# Patient Record
Sex: Female | Born: 1980 | Race: Asian | Hispanic: No | Marital: Single | Smoking: Former smoker
Health system: Southern US, Community
[De-identification: ages and names within clinical notes are randomized; demographics above are authoritative.]

## PROBLEM LIST (undated history)

## (undated) DIAGNOSIS — F32A Depression, unspecified: Secondary | ICD-10-CM

## (undated) DIAGNOSIS — F329 Major depressive disorder, single episode, unspecified: Secondary | ICD-10-CM

## (undated) DIAGNOSIS — D649 Anemia, unspecified: Secondary | ICD-10-CM

## (undated) DIAGNOSIS — O24419 Gestational diabetes mellitus in pregnancy, unspecified control: Secondary | ICD-10-CM

## (undated) HISTORY — PX: WISDOM TOOTH EXTRACTION: SHX21

## (undated) HISTORY — DX: Gestational diabetes mellitus in pregnancy, unspecified control: O24.419

---
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
## 2003-10-25 ENCOUNTER — Emergency Department (HOSPITAL_COMMUNITY): Admission: EM | Admit: 2003-10-25 | Discharge: 2003-10-25 | Payer: Self-pay | Admitting: Emergency Medicine

## 2004-02-05 HISTORY — PX: OTHER SURGICAL HISTORY: SHX169

## 2004-05-27 ENCOUNTER — Emergency Department (HOSPITAL_COMMUNITY): Admission: EM | Admit: 2004-05-27 | Discharge: 2004-05-27 | Payer: Self-pay | Admitting: *Deleted

## 2004-05-31 ENCOUNTER — Ambulatory Visit (HOSPITAL_COMMUNITY): Admission: RE | Admit: 2004-05-31 | Discharge: 2004-05-31 | Payer: Self-pay | Admitting: Orthopedic Surgery

## 2006-08-31 ENCOUNTER — Emergency Department (HOSPITAL_COMMUNITY): Admission: EM | Admit: 2006-08-31 | Discharge: 2006-08-31 | Payer: Self-pay | Admitting: Family Medicine

## 2006-09-03 ENCOUNTER — Emergency Department (HOSPITAL_COMMUNITY): Admission: EM | Admit: 2006-09-03 | Discharge: 2006-09-03 | Payer: Self-pay | Admitting: Family Medicine
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
## 2012-04-22 ENCOUNTER — Other Ambulatory Visit: Payer: Self-pay | Admitting: Family Medicine

## 2012-04-22 DIAGNOSIS — N63 Unspecified lump in unspecified breast: Secondary | ICD-10-CM

## 2012-04-29 ENCOUNTER — Ambulatory Visit
Admission: RE | Admit: 2012-04-29 | Discharge: 2012-04-29 | Disposition: A | Discharge status: Home / Self Care | Payer: BC Managed Care – PPO | Source: Ambulatory Visit | Attending: Family Medicine | Admitting: Family Medicine

## 2012-04-29 DIAGNOSIS — N63 Unspecified lump in unspecified breast: Secondary | ICD-10-CM

## 2012-06-02 ENCOUNTER — Other Ambulatory Visit: Payer: Self-pay

## 2012-06-22 ENCOUNTER — Other Ambulatory Visit: Payer: Self-pay | Admitting: Obstetrics and Gynecology

## 2012-06-22 ENCOUNTER — Other Ambulatory Visit (HOSPITAL_COMMUNITY)
Admission: RE | Admit: 2012-06-22 | Discharge: 2012-06-22 | Disposition: A | Discharge status: Home / Self Care | Payer: BC Managed Care – PPO | Source: Ambulatory Visit | Attending: Obstetrics and Gynecology | Admitting: Obstetrics and Gynecology

## 2012-06-22 DIAGNOSIS — Z1151 Encounter for screening for human papillomavirus (HPV): Secondary | ICD-10-CM | POA: Insufficient documentation

## 2012-06-22 DIAGNOSIS — Z113 Encounter for screening for infections with a predominantly sexual mode of transmission: Secondary | ICD-10-CM | POA: Insufficient documentation

## 2012-06-22 DIAGNOSIS — Z01419 Encounter for gynecological examination (general) (routine) without abnormal findings: Secondary | ICD-10-CM | POA: Insufficient documentation

## 2012-06-23 ENCOUNTER — Ambulatory Visit (HOSPITAL_COMMUNITY)
Admission: RE | Admit: 2012-06-23 | Discharge: 2012-06-23 | Disposition: A | Discharge status: Home / Self Care | Payer: BC Managed Care – PPO | Source: Ambulatory Visit | Attending: Obstetrics and Gynecology | Admitting: Obstetrics and Gynecology

## 2012-06-23 ENCOUNTER — Encounter (HOSPITAL_COMMUNITY): Payer: Self-pay

## 2012-06-23 ENCOUNTER — Ambulatory Visit (HOSPITAL_COMMUNITY): Payer: BC Managed Care – PPO

## 2012-06-23 VITALS — BP 116/67 | HR 69 | Wt 123.0 lb

## 2012-06-23 DIAGNOSIS — B181 Chronic viral hepatitis B without delta-agent: Secondary | ICD-10-CM

## 2012-06-23 DIAGNOSIS — O98419 Viral hepatitis complicating pregnancy, unspecified trimester: Secondary | ICD-10-CM

## 2012-06-23 NOTE — Consult Note (Signed)
Maternal Fetal Medicine Consultation  Requesting Provider(s): Gerald Leitz, MD  Reason for consultation: Hepatitis B surface antigen positive  HPI: Nicole Hooper is a 32 yo G1P0 currently at 10 1/7 weeks who is seen for consultation due to HepB SA positive status - noted on new OB labs.  Nicole Hooper reports some spotting during the first trimester - noted to have a friable cervical polyp on her new OB exam.  Her prenatal course has otherwise been uncomplicated.  The patient's HepBe antigen was negative.  Viral load was 1410 IU/ml.  Her baseline liver function tests were also within normal limits. She is without complaints today.  OB History: OB History   Grav Para Term Preterm Abortions TAB SAB Ect Mult Living   1               PMH: as above  PSH: left foot surgery  Meds: PNV  Allergies: NKDA  FH: denies family history of birth defects or hereditary disorders  Soc: quit smoking (e-cigarettes), denies ETOH or illicit drug use during pregnancy   PE:   Filed Vitals:   06/23/12 1532  BP: 116/67  Pulse: 69      A/P: 1) Single IUP at 10 1/7 weeks         2) Chronic HBV - HBV - Pregnancy is generally well-tolerated by women with chronic hepatitis B infection who do not have advanced liver disease.  Because pregnancy is an immune-tolerant state, there is a slightly increased risk for Hep B flares.  Recommend checking LFTs each trimester and post partum.  Nicole Hooper's Hep B viral load is 1410 IU/ml - not in the anti-viral treatment range.  Additionally, she is HepBe antigen negative and would anticipate a favorable outcome. The biggest concern is with perinatal transmission. Without prophylaxis, the perinatal transmission rate approaches 90%.  With HepB IG and the vaccination series as a newborn, the transmission rate is lowered to 5-10%.  Would recommend avoiding operative vaginal deliveries - recommend that Peds be notified at the time of delivery.  The newborn will need HepB IG and Hep B  vaccination series during the first 6 weeks of life.  Breast feeding is safe (if the newborn receives appropriate prophylaxis).  Cesarean delivery is not indicated for Hepatitis B - would reserve for obstetric indications only.    Thank you for the opportunity to be a part of the care of Nicole Hooper. Please contact our office if we can be of further assistance.   I spent approximately 30 minutes with this patient with over 50% of time spent in face-to-face counseling.  Alpha Gula, MD Maternal-Fetal Medicine

## 2012-06-23 NOTE — ED Notes (Signed)
Patient states she is experiencing spotting. She had a pap smear earlier in the week and states it is decreasing.

## 2012-10-01 ENCOUNTER — Inpatient Hospital Stay (HOSPITAL_COMMUNITY): Payer: BC Managed Care – PPO | Admitting: Anesthesiology

## 2012-10-01 ENCOUNTER — Inpatient Hospital Stay (HOSPITAL_COMMUNITY)
Admission: AD | Admit: 2012-10-01 | Discharge: 2012-10-04 | DRG: 370 | Disposition: A | Discharge status: Home / Self Care | Payer: BC Managed Care – PPO | Source: Ambulatory Visit | Attending: Obstetrics and Gynecology | Admitting: Obstetrics and Gynecology

## 2012-10-01 ENCOUNTER — Encounter (HOSPITAL_COMMUNITY): Payer: Self-pay | Admitting: *Deleted

## 2012-10-01 ENCOUNTER — Inpatient Hospital Stay (HOSPITAL_COMMUNITY): Payer: BC Managed Care – PPO

## 2012-10-01 ENCOUNTER — Encounter (HOSPITAL_COMMUNITY)
Admission: AD | Disposition: A | Discharge status: Home / Self Care | Payer: Self-pay | Source: Ambulatory Visit | Attending: Obstetrics and Gynecology

## 2012-10-01 ENCOUNTER — Encounter (HOSPITAL_COMMUNITY): Payer: Self-pay | Admitting: Anesthesiology

## 2012-10-01 DIAGNOSIS — O26619 Liver and biliary tract disorders in pregnancy, unspecified trimester: Secondary | ICD-10-CM | POA: Diagnosis present

## 2012-10-01 DIAGNOSIS — B181 Chronic viral hepatitis B without delta-agent: Secondary | ICD-10-CM | POA: Diagnosis present

## 2012-10-01 DIAGNOSIS — O321XX0 Maternal care for breech presentation, not applicable or unspecified: Principal | ICD-10-CM | POA: Diagnosis present

## 2012-10-01 DIAGNOSIS — IMO0002 Reserved for concepts with insufficient information to code with codable children: Secondary | ICD-10-CM | POA: Diagnosis present

## 2012-10-01 DIAGNOSIS — Z98891 History of uterine scar from previous surgery: Secondary | ICD-10-CM

## 2012-10-01 LAB — URINALYSIS, ROUTINE W REFLEX MICROSCOPIC
Nitrite: NEGATIVE
Protein, ur: NEGATIVE mg/dL
Specific Gravity, Urine: 1.015 (ref 1.005–1.030)
Urobilinogen, UA: 0.2 mg/dL (ref 0.0–1.0)
pH: 6.5 (ref 5.0–8.0)

## 2012-10-01 LAB — URINE MICROSCOPIC-ADD ON

## 2012-10-01 LAB — CBC
MCH: 29.7 pg (ref 26.0–34.0)
MCHC: 33.7 g/dL (ref 30.0–36.0)
Platelets: 198 10*3/uL (ref 150–400)
RBC: 3.44 MIL/uL — ABNORMAL LOW (ref 3.87–5.11)

## 2012-10-01 LAB — TYPE AND SCREEN: ABO/RH(D): O POS

## 2012-10-01 LAB — RPR: RPR Ser Ql: NONREACTIVE

## 2012-10-01 SURGERY — Surgical Case
Anesthesia: Spinal | Site: Abdomen | Wound class: Clean Contaminated

## 2012-10-01 MED ORDER — SCOPOLAMINE 1 MG/3DAYS TD PT72
MEDICATED_PATCH | TRANSDERMAL | Status: AC
  Filled 2012-10-01: qty 1

## 2012-10-01 MED ORDER — NALOXONE HCL 1 MG/ML IJ SOLN
1.0000 ug/kg/h | INTRAVENOUS | Status: DC | PRN
  Filled 2012-10-01: qty 2

## 2012-10-01 MED ORDER — KETOROLAC TROMETHAMINE 60 MG/2ML IM SOLN
INTRAMUSCULAR | Status: AC
  Filled 2012-10-01: qty 2

## 2012-10-01 MED ORDER — TERBUTALINE SULFATE 1 MG/ML IJ SOLN
INTRAMUSCULAR | Status: AC
  Administered 2012-10-01: 0.25 mg via SUBCUTANEOUS
  Filled 2012-10-01: qty 1

## 2012-10-01 MED ORDER — DIPHENHYDRAMINE HCL 25 MG PO CAPS
25.0000 mg | ORAL_CAPSULE | Freq: Four times a day (QID) | ORAL | Status: DC | PRN
  Filled 2012-10-01: qty 1

## 2012-10-01 MED ORDER — BUPIVACAINE IN DEXTROSE 0.75-8.25 % IT SOLN
INTRATHECAL | Status: DC | PRN
  Administered 2012-10-01: 11.25 mg via INTRATHECAL

## 2012-10-01 MED ORDER — ONDANSETRON HCL 4 MG/2ML IJ SOLN
INTRAMUSCULAR | Status: DC | PRN
  Administered 2012-10-01: 4 mg via INTRAVENOUS

## 2012-10-01 MED ORDER — FENTANYL CITRATE 0.05 MG/ML IJ SOLN
INTRAMUSCULAR | Status: AC
  Filled 2012-10-01: qty 2

## 2012-10-01 MED ORDER — LACTATED RINGERS IV SOLN
INTRAVENOUS | Status: DC | PRN
  Administered 2012-10-01 (×2): via INTRAVENOUS

## 2012-10-01 MED ORDER — IBUPROFEN 600 MG PO TABS
600.0000 mg | ORAL_TABLET | Freq: Four times a day (QID) | ORAL | Status: DC
  Administered 2012-10-01 – 2012-10-04 (×13): 600 mg via ORAL
  Filled 2012-10-01 (×14): qty 1

## 2012-10-01 MED ORDER — KETOROLAC TROMETHAMINE 30 MG/ML IJ SOLN: 30.0000 mg | Freq: Four times a day (QID) | INTRAMUSCULAR | Status: AC | PRN

## 2012-10-01 MED ORDER — DIPHENHYDRAMINE HCL 50 MG/ML IJ SOLN
INTRAMUSCULAR | Status: AC
  Filled 2012-10-01: qty 1

## 2012-10-01 MED ORDER — SCOPOLAMINE 1 MG/3DAYS TD PT72
1.0000 | MEDICATED_PATCH | Freq: Once | TRANSDERMAL | Status: AC
  Administered 2012-10-01: 1.5 mg via TRANSDERMAL

## 2012-10-01 MED ORDER — DIPHENHYDRAMINE HCL 50 MG/ML IJ SOLN
12.5000 mg | INTRAMUSCULAR | Status: DC | PRN
  Administered 2012-10-01: 12.5 mg via INTRAVENOUS

## 2012-10-01 MED ORDER — LANOLIN HYDROUS EX OINT: 1.0000 "application " | TOPICAL_OINTMENT | CUTANEOUS | Status: DC | PRN

## 2012-10-01 MED ORDER — MENTHOL 3 MG MT LOZG: 1.0000 | LOZENGE | OROMUCOSAL | Status: DC | PRN

## 2012-10-01 MED ORDER — ZOLPIDEM TARTRATE 5 MG PO TABS: 5.0000 mg | ORAL_TABLET | Freq: Every evening | ORAL | Status: DC | PRN

## 2012-10-01 MED ORDER — PHENYLEPHRINE HCL 10 MG/ML IJ SOLN
INTRAMUSCULAR | Status: DC | PRN
  Administered 2012-10-01: 80 ug via INTRAVENOUS
  Administered 2012-10-01: 40 ug via INTRAVENOUS
  Administered 2012-10-01: 80 ug via INTRAVENOUS

## 2012-10-01 MED ORDER — CEFAZOLIN SODIUM-DEXTROSE 2-3 GM-% IV SOLR
INTRAVENOUS | Status: AC
  Filled 2012-10-01: qty 50

## 2012-10-01 MED ORDER — DIPHENHYDRAMINE HCL 25 MG PO CAPS
25.0000 mg | ORAL_CAPSULE | ORAL | Status: DC | PRN
  Administered 2012-10-01: 25 mg via ORAL
  Filled 2012-10-01: qty 1

## 2012-10-01 MED ORDER — OXYTOCIN 40 UNITS IN LACTATED RINGERS INFUSION - SIMPLE MED: 62.5000 mL/h | INTRAVENOUS | Status: DC

## 2012-10-01 MED ORDER — PRENATAL MULTIVITAMIN CH
1.0000 | ORAL_TABLET | Freq: Every day | ORAL | Status: DC
Start: 2012-10-01 — End: 2012-10-01

## 2012-10-01 MED ORDER — MORPHINE SULFATE (PF) 0.5 MG/ML IJ SOLN
INTRAMUSCULAR | Status: DC | PRN
  Administered 2012-10-01: .1 mg via INTRATHECAL

## 2012-10-01 MED ORDER — OXYTOCIN 10 UNIT/ML IJ SOLN
40.0000 [IU] | INTRAVENOUS | Status: DC | PRN
  Administered 2012-10-01: 40 [IU] via INTRAVENOUS

## 2012-10-01 MED ORDER — NALBUPHINE HCL 10 MG/ML IJ SOLN
5.0000 mg | INTRAMUSCULAR | Status: DC | PRN
  Filled 2012-10-01: qty 1

## 2012-10-01 MED ORDER — MEPERIDINE HCL 25 MG/ML IJ SOLN: 6.2500 mg | INTRAMUSCULAR | Status: DC | PRN

## 2012-10-01 MED ORDER — SIMETHICONE 80 MG PO CHEW
80.0000 mg | CHEWABLE_TABLET | Freq: Three times a day (TID) | ORAL | Status: DC
  Administered 2012-10-01 – 2012-10-03 (×11): 80 mg via ORAL

## 2012-10-01 MED ORDER — TERBUTALINE SULFATE 1 MG/ML IJ SOLN
0.2500 mg | Freq: Once | INTRAMUSCULAR | Status: AC
  Administered 2012-10-01: 0.25 mg via SUBCUTANEOUS

## 2012-10-01 MED ORDER — CEFAZOLIN SODIUM-DEXTROSE 2-3 GM-% IV SOLR
INTRAVENOUS | Status: DC | PRN
  Administered 2012-10-01: 2 g via INTRAVENOUS

## 2012-10-01 MED ORDER — BETAMETHASONE SOD PHOS & ACET 6 (3-3) MG/ML IJ SUSP
12.0000 mg | Freq: Once | INTRAMUSCULAR | Status: AC
  Administered 2012-10-01: 12 mg via INTRAMUSCULAR
  Filled 2012-10-01: qty 2

## 2012-10-01 MED ORDER — PRENATAL MULTIVITAMIN CH
1.0000 | ORAL_TABLET | Freq: Every day | ORAL | Status: DC
  Administered 2012-10-01 – 2012-10-04 (×4): 1 via ORAL
  Filled 2012-10-01 (×4): qty 1

## 2012-10-01 MED ORDER — DIBUCAINE 1 % RE OINT: 1.0000 "application " | TOPICAL_OINTMENT | RECTAL | Status: DC | PRN

## 2012-10-01 MED ORDER — NALBUPHINE HCL 10 MG/ML IJ SOLN
5.0000 mg | INTRAMUSCULAR | Status: DC | PRN
Start: 2012-10-01 — End: 2012-10-04
  Filled 2012-10-01: qty 1

## 2012-10-01 MED ORDER — LACTATED RINGERS IV SOLN: INTRAVENOUS | Status: DC

## 2012-10-01 MED ORDER — ONDANSETRON HCL 4 MG/2ML IJ SOLN: 4.0000 mg | INTRAMUSCULAR | Status: DC | PRN

## 2012-10-01 MED ORDER — METHYLERGONOVINE MALEATE 0.2 MG PO TABS: 0.2000 mg | ORAL_TABLET | ORAL | Status: DC | PRN

## 2012-10-01 MED ORDER — KETOROLAC TROMETHAMINE 60 MG/2ML IM SOLN
60.0000 mg | Freq: Once | INTRAMUSCULAR | Status: AC | PRN
  Administered 2012-10-01: 60 mg via INTRAMUSCULAR
  Filled 2012-10-01: qty 2

## 2012-10-01 MED ORDER — METHYLERGONOVINE MALEATE 0.2 MG/ML IJ SOLN: 0.2000 mg | INTRAMUSCULAR | Status: DC | PRN

## 2012-10-01 MED ORDER — FENTANYL CITRATE 0.05 MG/ML IJ SOLN: 25.0000 ug | INTRAMUSCULAR | Status: DC | PRN

## 2012-10-01 MED ORDER — ONDANSETRON HCL 4 MG PO TABS: 4.0000 mg | ORAL_TABLET | ORAL | Status: DC | PRN

## 2012-10-01 MED ORDER — MEDROXYPROGESTERONE ACETATE 150 MG/ML IM SUSP: 150.0000 mg | INTRAMUSCULAR | Status: DC | PRN

## 2012-10-01 MED ORDER — SENNOSIDES-DOCUSATE SODIUM 8.6-50 MG PO TABS
2.0000 | ORAL_TABLET | Freq: Every day | ORAL | Status: DC
  Administered 2012-10-01 – 2012-10-03 (×3): 2 via ORAL

## 2012-10-01 MED ORDER — OXYTOCIN 10 UNIT/ML IJ SOLN
INTRAMUSCULAR | Status: AC
  Filled 2012-10-01: qty 4

## 2012-10-01 MED ORDER — LACTATED RINGERS IV BOLUS (SEPSIS): 1000.0000 mL | INTRAVENOUS | Status: DC

## 2012-10-01 MED ORDER — ONDANSETRON HCL 4 MG/2ML IJ SOLN
INTRAMUSCULAR | Status: AC
  Filled 2012-10-01: qty 2

## 2012-10-01 MED ORDER — TETANUS-DIPHTH-ACELL PERTUSSIS 5-2.5-18.5 LF-MCG/0.5 IM SUSP: 0.5000 mL | Freq: Once | INTRAMUSCULAR | Status: DC

## 2012-10-01 MED ORDER — LACTATED RINGERS IV SOLN
INTRAVENOUS | Status: DC
  Administered 2012-10-01: 05:00:00 via INTRAVENOUS

## 2012-10-01 MED ORDER — WITCH HAZEL-GLYCERIN EX PADS: 1.0000 "application " | MEDICATED_PAD | CUTANEOUS | Status: DC | PRN

## 2012-10-01 MED ORDER — MORPHINE SULFATE 0.5 MG/ML IJ SOLN
INTRAMUSCULAR | Status: AC
  Filled 2012-10-01: qty 10

## 2012-10-01 MED ORDER — SODIUM CHLORIDE 0.9 % IJ SOLN: 3.0000 mL | INTRAMUSCULAR | Status: DC | PRN

## 2012-10-01 MED ORDER — METOCLOPRAMIDE HCL 5 MG/ML IJ SOLN: 10.0000 mg | Freq: Three times a day (TID) | INTRAMUSCULAR | Status: DC | PRN

## 2012-10-01 MED ORDER — FERROUS SULFATE 325 (65 FE) MG PO TABS
325.0000 mg | ORAL_TABLET | Freq: Two times a day (BID) | ORAL | Status: DC
  Administered 2012-10-01 – 2012-10-04 (×7): 325 mg via ORAL
  Filled 2012-10-01 (×7): qty 1

## 2012-10-01 MED ORDER — FENTANYL CITRATE 0.05 MG/ML IJ SOLN
INTRAMUSCULAR | Status: DC | PRN
  Administered 2012-10-01: 12.5 ug via INTRATHECAL

## 2012-10-01 MED ORDER — PHENYLEPHRINE 40 MCG/ML (10ML) SYRINGE FOR IV PUSH (FOR BLOOD PRESSURE SUPPORT)
PREFILLED_SYRINGE | INTRAVENOUS | Status: AC
  Filled 2012-10-01: qty 5

## 2012-10-01 MED ORDER — FENTANYL CITRATE 0.05 MG/ML IJ SOLN
INTRAMUSCULAR | Status: DC | PRN
  Administered 2012-10-01: 87.5 ug via INTRAVENOUS

## 2012-10-01 MED ORDER — MORPHINE SULFATE (PF) 0.5 MG/ML IJ SOLN
INTRAMUSCULAR | Status: DC | PRN
  Administered 2012-10-01: 4.9 mg via INTRAVENOUS

## 2012-10-01 MED ORDER — CITRIC ACID-SODIUM CITRATE 334-500 MG/5ML PO SOLN
ORAL | Status: AC
  Administered 2012-10-01: 30 mL
  Filled 2012-10-01: qty 15

## 2012-10-01 MED ORDER — NALOXONE HCL 0.4 MG/ML IJ SOLN: 0.4000 mg | INTRAMUSCULAR | Status: DC | PRN

## 2012-10-01 MED ORDER — SIMETHICONE 80 MG PO CHEW: 80.0000 mg | CHEWABLE_TABLET | ORAL | Status: DC | PRN

## 2012-10-01 MED ORDER — DIPHENHYDRAMINE HCL 50 MG/ML IJ SOLN: 25.0000 mg | INTRAMUSCULAR | Status: DC | PRN

## 2012-10-01 MED ORDER — OXYCODONE-ACETAMINOPHEN 5-325 MG PO TABS
1.0000 | ORAL_TABLET | ORAL | Status: DC | PRN
  Administered 2012-10-02 – 2012-10-04 (×6): 1 via ORAL
  Filled 2012-10-01 (×5): qty 1
  Filled 2012-10-01: qty 2
  Filled 2012-10-01: qty 1

## 2012-10-01 MED ORDER — ONDANSETRON HCL 4 MG/2ML IJ SOLN: 4.0000 mg | Freq: Three times a day (TID) | INTRAMUSCULAR | Status: DC | PRN

## 2012-10-01 SURGICAL SUPPLY — 39 items
BARRIER ADHS 3X4 INTERCEED (GAUZE/BANDAGES/DRESSINGS) IMPLANT
BENZOIN TINCTURE PRP APPL 2/3 (GAUZE/BANDAGES/DRESSINGS) ×2 IMPLANT
CLAMP CORD UMBIL (MISCELLANEOUS) IMPLANT
CLOTH BEACON ORANGE TIMEOUT ST (SAFETY) ×2 IMPLANT
CONTAINER PREFILL 10% NBF 15ML (MISCELLANEOUS) IMPLANT
DRAPE LG THREE QUARTER DISP (DRAPES) ×2 IMPLANT
DRSG OPSITE POSTOP 4X10 (GAUZE/BANDAGES/DRESSINGS) ×2 IMPLANT
DURAPREP 26ML APPLICATOR (WOUND CARE) ×2 IMPLANT
ELECT REM PT RETURN 9FT ADLT (ELECTROSURGICAL) ×2
ELECTRODE REM PT RTRN 9FT ADLT (ELECTROSURGICAL) ×1 IMPLANT
EXTRACTOR VACUUM M CUP 4 TUBE (SUCTIONS) IMPLANT
GLOVE BIOGEL M 6.5 STRL (GLOVE) ×4 IMPLANT
GLOVE BIOGEL PI IND STRL 6.5 (GLOVE) ×1 IMPLANT
GLOVE BIOGEL PI INDICATOR 6.5 (GLOVE) ×1
GOWN PREVENTION PLUS XLARGE (GOWN DISPOSABLE) ×2 IMPLANT
GOWN STRL REIN XL XLG (GOWN DISPOSABLE) ×4 IMPLANT
KIT ABG SYR 3ML LUER SLIP (SYRINGE) ×2 IMPLANT
NEEDLE HYPO 25X5/8 SAFETYGLIDE (NEEDLE) ×2 IMPLANT
NS IRRIG 1000ML POUR BTL (IV SOLUTION) ×2 IMPLANT
PACK C SECTION WH (CUSTOM PROCEDURE TRAY) ×2 IMPLANT
PAD ABD 7.5X8 STRL (GAUZE/BANDAGES/DRESSINGS) ×2 IMPLANT
PAD OB MATERNITY 4.3X12.25 (PERSONAL CARE ITEMS) ×2 IMPLANT
RTRCTR C-SECT PINK 25CM LRG (MISCELLANEOUS) ×2 IMPLANT
RTRCTR C-SECT PINK 34CM XLRG (MISCELLANEOUS) IMPLANT
STAPLER VISISTAT 35W (STAPLE) IMPLANT
STRIP CLOSURE SKIN 1/2X4 (GAUZE/BANDAGES/DRESSINGS) ×2 IMPLANT
SUT PDS AB 0 CT1 27 (SUTURE) ×4 IMPLANT
SUT PLAIN 0 NONE (SUTURE) IMPLANT
SUT VIC AB 0 CTX 36 (SUTURE) ×3
SUT VIC AB 0 CTX36XBRD ANBCTRL (SUTURE) ×3 IMPLANT
SUT VIC AB 2-0 CT1 27 (SUTURE) ×1
SUT VIC AB 2-0 CT1 TAPERPNT 27 (SUTURE) ×1 IMPLANT
SUT VIC AB 3-0 SH 27 (SUTURE) ×1
SUT VIC AB 3-0 SH 27X BRD (SUTURE) ×1 IMPLANT
SUT VIC AB 4-0 KS 27 (SUTURE) ×2 IMPLANT
TAPE CLOTH SURG 4X10 WHT LF (GAUZE/BANDAGES/DRESSINGS) ×2 IMPLANT
TOWEL OR 17X24 6PK STRL BLUE (TOWEL DISPOSABLE) ×2 IMPLANT
TRAY FOLEY CATH 14FR (SET/KITS/TRAYS/PACK) ×2 IMPLANT
WATER STERILE IRR 1000ML POUR (IV SOLUTION) IMPLANT

## 2012-10-01 NOTE — Anesthesia Postprocedure Evaluation (Signed)
  Anesthesia Post-op Note  Patient: Nicole Hooper  Procedure(s) Performed: Procedure(s) (LRB): CESAREAN SECTION (N/A)  Patient Location: PACU  Anesthesia Type: Spinal  Level of Consciousness: awake and alert   Airway and Oxygen Therapy: Patient Spontanous Breathing  Post-op Pain: mild  Post-op Assessment: Post-op Vital signs reviewed, Patient's Cardiovascular Status Stable, Respiratory Function Stable, Patent Airway and No signs of Nausea or vomiting  Last Vitals:  Filed Vitals:   10/01/12 0051  BP:   Pulse: 90  Resp:     Post-op Vital Signs: stable   Complications: No apparent anesthesia complications

## 2012-10-01 NOTE — MAU Note (Signed)
Patient transferred to [facility] rm 167 in trendelenburg with Ivonne Andrew CNM and NICU. No cervix felt per Ivonne Andrew CNM

## 2012-10-01 NOTE — Progress Notes (Signed)
Ernest is still processing that her baby is unexpectedly here already.  She has not even been down to see her baby yet.  We will follow up with her later today or tomorrow morning after she has had a chance to take in some of what is happening.  [employer] Pager, [phone] 10:58 AM   10/01/12 1000  Clinical Encounter Type  Visited With Patient  Visit Type Initial

## 2012-10-01 NOTE — MAU Note (Signed)
Pt reports contractions today, worsening tonight, some vaginal bleeding this am, leaking fluid since 1400

## 2012-10-01 NOTE — Anesthesia Postprocedure Evaluation (Signed)
  Anesthesia Post-op Note  Patient: Nicole Hooper  Procedure(s) Performed: Procedure(s): CESAREAN SECTION (N/A)  Patient Location: Women's Unit  Anesthesia Type:Spinal  Level of Consciousness: awake, alert  and oriented  Airway and Oxygen Therapy: Patient Spontanous Breathing  Post-op Pain: none  Post-op Assessment: Post-op Vital signs reviewed, Patient's Cardiovascular Status Stable, No headache, No backache, No residual numbness and No residual motor weakness  Post-op Vital Signs: Reviewed and stable  Complications: No apparent anesthesia complications

## 2012-10-01 NOTE — Progress Notes (Addendum)
Patient applied double breast pump to breast starting at 1130. Will keep applied for 15 minutes. Explained that this will help to stimulate breast and help to stimulate milk production. Clinton Sawyer [place], RN, BSN 11:37 AM  Colostrum was noted, but scant amount. Explained to patient that she will need to try and pump again in 3-4 hours. Gave encouragement and explained what colostrum was and that it was a positive sign.  Clinton Sawyer [place], RN, BSN 12:20 PM

## 2012-10-01 NOTE — H&P (Signed)
Chief Complaint:  No chief complaint on file.  First Provider Initiated Contact with Patient 10/01/12 0056     HPI: Nicole Hooper is a 32 y.o. G1P0 at [redacted]w[redacted]d by 6 weeks 1 day ultrasound who presents to maternity admissions reporting contractions every 5 minutes x2 hours and a small gush of fluid. Last intercourse 2 days ago. Denies vaginal bleeding. Good fetal movement.   Pregnancy Course: Patient has large cervical polyp and was instructed to be on pelvic rest. Pt is a chronic hepatitis B carrier.   Past Medical History: Hepatitis B chronic carrier   Past obstetric history: OB History  Gravida Para Term Preterm AB SAB TAB Ectopic Multiple Living  1             # Outcome Date GA Lbr Len/2nd Weight Sex Delivery Anes PTL Lv  1 CUR               Past Surgical History: Foot surgery Family History: No family history on file.  Social History: History  Substance Use Topics  . Smoking status: Not on file  . Smokeless tobacco: Not on file  . Alcohol Use: Not on file    Allergies: No Known Allergies  Meds:  Prescriptions prior to admission  Medication Sig Dispense Refill  . Prenatal Vit-Fe Fumarate-FA (PRENATAL MULTIVITAMIN) TABS Take 1 tablet by mouth daily at 12 noon.        ROS: Pertinent findings in history of present illness.  Physical Exam  Last menstrual period 04/11/2012. Patient Vitals for the past 24 hrs:  BP Pulse Resp SpO2  10/01/12 0020 113/77 mmHg 93 18 100 %    GENERAL: Well-developed, well-nourished female in severe distress, bearing down involuntarily.  HEENT: normocephalic HEART: normal rate RESP: normal effort ABDOMEN: Soft, non-tender, gravid appropriate for gestational age EXTREMITIES: Nontender, no edema NEURO: alert and oriented PELVIC EXAM: Normal external female genitalia, bulging bag of water visible when labia spread. Cervix feels completely dilated with bulging bag of water at +2. Unable to determine presenting part. No leaking of fluid. Exam  limited by desire to leave membranes intact. FHT:  Baseline ?110  Contractions: q 3-5 mins, strong   Labs: No results found for this or any previous visit (from the past 24 hour(s)).  Imaging:  No results found.  MAU Course: Patient and enough control to be transported to labor and delivery due to concern of imminent delivery. Betamethasone given. Bedside ultrasound ordered for presentation and to assess for possible hourglassing of membranes through and completely dilated cervix and to evaluate position of cervical polyp.  Nicole Hooper called, en route. ED 10 minutes. NICU team at bedside.  While awaiting bedside ultrasound Nicole Hooper, attending with faculty practice called for bedside ultrasound. Complete breech and positive cardiac activity verified. OR notified of plan for C-section. Nicole Hooper arrived soon after. Confirmed complete dilation and discussed ultrasound with Nicole Hooper. Obtained verbal and written consent from patient for emergency C-section for breech presentation. Patient gave consent. Last food and drink at 9:30 PM.  Assessment: Preterm labor, complete dilation with breech presentation.  Plan: Prep for OR.  Dorathy Hooper, CNM 10/01/2012 1:05   Upon my arrival to Labor and Delivery I examined Nicole Hooper and confirmed that she was completely dilated with a bulging bag. Recommended cesarean section due to breech presentation. R/B/A of surgery were discussed with the patient including but not limited to infection bleeding damage to bowel bladder baby with the need for further surgery. R/O transfusion  HIV/ Hep B&C discussed. Pt voiced understanding and agreed with proceeding with cesarean section.

## 2012-10-01 NOTE — Transfer of Care (Signed)
Immediate Anesthesia Transfer of Care Note  Patient: Nicole Hooper  Procedure(s) Performed: Procedure(s): CESAREAN SECTION (N/A)  Patient Location: PACU  Anesthesia Type:Spinal  Level of Consciousness: awake  Airway & Oxygen Therapy: Patient Spontanous Breathing  Post-op Assessment: Report given to PACU RN and Post -op Vital signs reviewed and stable  Post vital signs: stable  Complications: No apparent anesthesia complications

## 2012-10-01 NOTE — Progress Notes (Signed)
10/01/12 1600  Clinical Encounter Type  Visited With Patient not available  Visit Type Follow-up  Referral From [facility])   Attempted follow-up visit, but pt sleeping.  Spiritual Care will follow for support.  [address] [place], [place] [phone]

## 2012-10-01 NOTE — MAU Provider Note (Signed)
Chief Complaint:  No chief complaint on file.  First Provider Initiated Contact with Patient 10/01/12 0056     HPI: Nicole Hooper is a 32 y.o. G1P0 at [redacted]w[redacted]d by 6 weeks 1 day ultrasound who presents to maternity admissions reporting contractions every 5 minutes x2 hours and a small gush of fluid. Last intercourse 2 days ago. Denies vaginal bleeding. Good fetal movement.   Pregnancy Course: Patient has large cervical polyp and was instructed to be on pelvic rest.  Past Medical History: History reviewed. No pertinent past medical history.  Past obstetric history: OB History  Gravida Para Term Preterm AB SAB TAB Ectopic Multiple Living  1             # Outcome Date GA Lbr Len/2nd Weight Sex Delivery Anes PTL Lv  1 CUR               Past Surgical History: Foot surgery Family History: No family history on file.  Social History: History  Substance Use Topics  . Smoking status: Not on file  . Smokeless tobacco: Not on file  . Alcohol Use: Not on file    Allergies: No Known Allergies  Meds:  Prescriptions prior to admission  Medication Sig Dispense Refill  . Prenatal Vit-Fe Fumarate-FA (PRENATAL MULTIVITAMIN) TABS Take 1 tablet by mouth daily at 12 noon.        ROS: Pertinent findings in history of present illness.  Physical Exam  Last menstrual period 04/11/2012. Patient Vitals for the past 24 hrs:  BP Pulse Resp SpO2  10/01/12 0020 113/77 mmHg 93 18 100 %    GENERAL: Well-developed, well-nourished female in severe distress, bearing down involuntarily.  HEENT: normocephalic HEART: normal rate RESP: normal effort ABDOMEN: Soft, non-tender, gravid appropriate for gestational age EXTREMITIES: Nontender, no edema NEURO: alert and oriented PELVIC EXAM: Normal external female genitalia, bulging bag of water visible when labia spread. Cervix feels completely dilated with bulging bag of water at +2. Unable to determine presenting part. No leaking of fluid. Exam limited by  desire to leave membranes intact. FHT:  Baseline ?110  Contractions: q 3-5 mins, strong   Labs: No results found for this or any previous visit (from the past 24 hour(s)).  Imaging:  No results found.  MAU Course: Patient and enough control to be transported to labor and delivery due to concern of imminent delivery. Betamethasone given. Bedside ultrasound ordered for presentation and to assess for possible hourglassing of membranes through and completely dilated cervix and to evaluate position of cervical polyp.  Dr. Richardson Dopp called, en route. ED 10 minutes. NICU team at bedside.  While awaiting bedside ultrasound Dr. Emelda Fear, attending with faculty practice called for bedside ultrasound. Complete breech and positive cardiac activity verified. OR notified of plan for C-section. Dr. Richardson Dopp arrived soon after. Confirmed complete dilation and discussed ultrasound with Dr. Emelda Fear. Obtained verbal and written consent from patient for emergency C-section for breech presentation. Patient gave consent. Last food and drink at 9:30 PM.  Assessment: Preterm labor, complete dilation with breech presentation.  Plan: Prep for OR.  [place], CNM 10/01/2012 1:05 AM

## 2012-10-01 NOTE — Anesthesia Preprocedure Evaluation (Signed)
Anesthesia Evaluation  Patient identified by MRN, date of birth, ID band Patient awake    Reviewed: Allergy & Precautions, H&P , NPO status , Patient's Chart, lab work & pertinent test results  History of Anesthesia Complications Negative for: history of anesthetic complications  Airway Mallampati: II TM Distance: >3 FB Neck ROM: full    Dental no notable dental hx. (+) Teeth Intact   Pulmonary neg pulmonary ROS,  breath sounds clear to auscultation  Pulmonary exam normal       Cardiovascular negative cardio ROS  Rhythm:regular Rate:Normal     Neuro/Psych negative neurological ROS  negative psych ROS   GI/Hepatic negative GI ROS, Neg liver ROS,   Endo/Other  negative endocrine ROS  Renal/GU negative Renal ROS  negative genitourinary   Musculoskeletal   Abdominal Normal abdominal exam  (+)   Peds  Hematology negative hematology ROS (+)   Anesthesia Other Findings   Reproductive/Obstetrics (+) Pregnancy                           Anesthesia Physical Anesthesia Plan  ASA: II and emergent  Anesthesia Plan: Spinal   Post-op Pain Management:    Induction:   Airway Management Planned:   Additional Equipment:   Intra-op Plan:   Post-operative Plan:   Informed Consent: I have reviewed the patients History and Physical, chart, labs and discussed the procedure including the risks, benefits and alternatives for the proposed anesthesia with the patient or authorized representative who has indicated his/her understanding and acceptance.   Dental advisory given  Plan Discussed with: CRNA  Anesthesia Plan Comments: (Stat c/s for breech 24 weeks fully dilated in labor)        Anesthesia Quick Evaluation

## 2012-10-01 NOTE — Op Note (Signed)
10/01/2012  2:25 AM  PATIENT:  Nicole Hooper  32 y.o. female  PRE-OPERATIVE DIAGNOSIS:  24week/3days, preterm labor with complete dilatation./ Complete Breech  POST-OPERATIVE DIAGNOSIS:  24week/3days, Preterm labor with complete dilatation/ Complete Breech  PROCEDURE:  Procedure(s): CESAREAN SECTION (N/A) Classical Cesarean Section   SURGEON:  Surgeon(s) and Role:    * Dorien Chihuahua. Richardson Dopp, MD - Primary  PHYSICIAN ASSISTANT:   ASSISTANTS: CNM Sanda Klein    ANESTHESIA:   spinal  EBL: 600 cc Total I/O In: 1900 [I.V.:1900] Out: 750 [Urine:150; Blood:600]  BLOOD ADMINISTERED:none  DRAINS: Urinary Catheter (Foley)   LOCAL MEDICATIONS USED:  NONE  SPECIMEN:  Source of Specimen:  placenta   DISPOSITION OF SPECIMEN:  PATHOLOGY  COUNTS:  YES  TOURNIQUET:  * No tourniquets in log *  DICTATION: .Dragon Dictation  PLAN OF CARE: Admit to inpatient   PATIENT DISPOSITION:  PACU - hemodynamically stable.   Delay start of Pharmacological VTE agent (>24hrs) due to surgical blood loss or risk of bleeding: not applicable  Findings: Preterm infant in the breech presentation. Normal fallopian tubes and ovaries   Indication: this is a 24 wk 3 day EGA with preterm labor and complete dilation on presentation to the hospital . BUlging bag in the vagina. Breech presentation by ultrasound.   Risk benefits and alternatives of cesarean section were discussed with the patient including but not limited to infection, bleeding, damage to bowel , bladder and baby with the need for further surgery. Risk of transfusion HIV/ Hep B&C. Pt voiced understanding and desires to proceed.   Procedure: Stat cesarean section was called patient was taken to the operating room where spinal anesthetic was placed timeout was performed. She was prepped Foley catheter was placed and draped in a sterile fashion area and still skin incision was made with the scalpel. Carried down to underlying layer of fascia. Fascia was  incised in the midline. The incision was extended laterally. The inferior portion of the incision was grasped with Coker clamps elevated underlying rectus muscles were dissected off this is repeated on the anterior portion of the incision. Peritoneal was identified and entered bluntly. A Lex retractor was placed. Vesicouterine peritoneum was identified entered sharply with Metzenbaum scissors and extended laterally. Classical incision was made with the scalpel. And the infant was delivered from a breech presentation without difficulty. The infant was handed to the awaiting neonatologist and intubated.  Arterial blood gas was drawn. Followed by cord blood. The placenta was then expressed the uterine incision was closed with running locked layer of 0 Vicryl. Second layer of 0 Vicryl was used as well. The serosa was reapproximated with 3-0 Monocryl. The uterus was returned to the pelvis in the pelvis was then copiously irrigated. Excellent hemostasis was noted. Interceed was placed along the classical uterine incision.  The a Lex retractor was removed. Fascia was reapproximated with 0 PDS. The Scarpa's fascia was reapproximated with 2-0 Vicryl. Skin was reapproximated with 4-0 Vicryl. Sponge lap and needle counts were correct x2 the patient was taken the recovery room in stable condition.

## 2012-10-01 NOTE — Anesthesia Procedure Notes (Signed)
Spinal  Patient location during procedure: OR Staffing Anesthesiologist: Phillips Grout Performed by: anesthesiologist  Preanesthetic Checklist Completed: patient identified, site marked, surgical consent, pre-op evaluation, timeout performed, IV checked, risks and benefits discussed and monitors and equipment checked Spinal Block Patient position: right lateral decubitus Prep: ChloraPrep Patient monitoring: heart rate, continuous pulse ox and blood pressure Approach: midline Location: L4-5 Injection technique: single-shot Needle Needle type: Sprotte  Needle gauge: 24 G Needle length: 9 cm Additional Notes Expiration date of kit checked and confirmed. Patient tolerated procedure well, without complications.

## 2012-10-01 NOTE — Lactation Note (Signed)
This note was copied from the chart of Girl Wilhelmena Zea. Lactation Consultation Note   Initial consult with this mom of a NICU baby, now 41 hours old. Baby is a 24 3/[redacted] week gestation baby. I set up a DEP and did teaching on providing breast milk for a NICU baby. I did some brief hand expression with mom - her breasts are very tender. Mom looks exhasted - I will do more teaching with her tomorrow.   Patient Name: Girl Xinyi Batton Today's Date: 10/01/2012 Reason for consult: Initial assessment;NICU baby   Maternal Data Formula Feeding for Exclusion: Yes (baby in NICU) Infant to breast within first hour of birth: No Breastfeeding delayed due to:: Infant status Has patient been taught Hand Expression?: Yes Does the patient have breastfeeding experience prior to this delivery?: No  Feeding    LATCH Score/Interventions                      Lactation Tools Discussed/Used Tools: Pump Breast pump type: Double-Electric Breast Pump WIC Program: No Pump Review: Setup, frequency, and cleaning;Milk Storage;Other (comment) Initiated by:: bedside nurse and c Miia Blanks at 11 hours post partum Date initiated:: 10/01/12   Consult Status Consult Status: Follow-up Date: 10/02/12 Follow-up type: In-patient    Alfred Levins 10/01/2012, 12:54 PM

## 2012-10-02 ENCOUNTER — Encounter (HOSPITAL_COMMUNITY): Payer: Self-pay | Admitting: Obstetrics and Gynecology

## 2012-10-02 LAB — CBC
HCT: 26.9 % — ABNORMAL LOW (ref 36.0–46.0)
Hemoglobin: 9 g/dL — ABNORMAL LOW (ref 12.0–15.0)
MCHC: 33.5 g/dL (ref 30.0–36.0)
MCV: 88.5 fL (ref 78.0–100.0)
RDW: 13.5 % (ref 11.5–15.5)

## 2012-10-02 NOTE — Progress Notes (Signed)
Subjective: Postpartum Day 0:  Classical Cesarean Delivery Patient is comfortable, pain is well controlled.  Pt states baby in NICU is doing "great."   Objective: Vital signs in last 24 hours: Temp:  [97.5 F (36.4 C)-99.8 F (37.7 C)] 97.5 F (36.4 C) (08/28 2117) Pulse Rate:  [65-101] 87 (08/28 2117) Resp:  [12-28] 16 (08/28 2117) BP: (91-115)/(56-77) 106/75 mmHg (08/28 2117) SpO2:  [94 %-99 %] 98 % (08/28 2117) Weight:  [55.792 kg (123 lb)] 55.792 kg (123 lb) (08/28 0549)  Physical Exam:  General: NAD, A&O x 3 Uterine Fundus: deferred Incision: dressing clean and dry DVT Evaluation: No edema or calf tenderness   Recent Labs  10/01/12 0325  HGB 10.2*  HCT 30.3*    Assessment/Plan: Status post Cesarean section. Doing well postoperatively. Routine post op care.  Nicole Hooper 10/02/2012, 1:04 AM

## 2012-10-02 NOTE — Progress Notes (Signed)
10/02/12 1600  Clinical Encounter Type  Visited With Patient and family together  Visit Type Follow-up;Spiritual support;Social support  Spiritual Encounters  Spiritual Needs Emotional   Spiritual Care is following family closely for support. Met Nicole Hooper and husband Nicole Hooper this morning, and have followed up with family and NICU staff this afternoon.  Nicole Hooper had to leave the hospital to help as his shop, a nail salon, had gotten very busy.  Spoke with Nicole Hooper and her stepdaughter as Nicole Hooper pumped.  She is asking questions about the likelihood of her baby's survival (referred her to baby's doctor).  Aunt and cousin visited earlier; mom and sister are due to visit this evening.  Other sister is a travel Engineer, civil (consulting) and just moved to [facility] after having worked in an ICU at [employer]; Nicole Hooper has given her baby's code, and sister has been calling for updates.  Nicole Hooper states that she appreciates that her sister asks lots of questions and fills her in.  Please page as needed throughout the weekend:  [phone].  Will notify weekend chaplain of situation to promote continuity of care and emotional support to family.  [address] [place], [place] [phone]

## 2012-10-02 NOTE — Progress Notes (Signed)
I received a page this morning from NICU staff alerting me that baby was not doing as well today.  I offered emotional and spiritual support to family as well as prayer and kept in communication with NICU staff as well as staff on [facility] Unit where Pakou is a patient.  I introduced family to Caro Hight who will be with them this afternoon.  There are chaplains who cover [facility] 24 hours/day.  Please page [place] at [phone] as needs arise.  Chaplain Dyanne Carrel 1:43 PM   10/02/12 1300  Clinical Encounter Type  Visited With Patient and family together  Visit Type Spiritual support  Referral From Nurse  Spiritual Encounters  Spiritual Needs Emotional;Prayer

## 2012-10-02 NOTE — Progress Notes (Signed)
Patient is Rubella Immune per Deboraha Sprang MD Office. Clinton Sawyer [place], RN, BSN 11:17 AM

## 2012-10-02 NOTE — Progress Notes (Signed)
Subjective: Postpartum Day 2: Cesarean Delivery Patient reports tolerating PO, + flatus and no problems voiding.    Objective: Vital signs in last 24 hours: Temp:  [97.5 F (36.4 C)-98.4 F (36.9 C)] 98.4 F (36.9 C) (08/29 0625) Pulse Rate:  [65-87] 65 (08/29 0625) Resp:  [15-16] 16 (08/29 0625) BP: (91-106)/(50-75) 97/50 mmHg (08/29 0625) SpO2:  [95 %-99 %] 99 % (08/29 0625)  Physical Exam:  General: alert and cooperative Lochia: appropriate Uterine Fundus: firm Incision: healing well DVT Evaluation: No evidence of DVT seen on physical exam.   Recent Labs  10/01/12 0325 10/02/12 0515  HGB 10.2* 9.0*  HCT 30.3* 26.9*    Assessment/Plan: Status post Cesarean section. Doing well postoperatively.  Continue current care.  Nicole Hooper J. 10/02/2012, 7:48 AM

## 2012-10-02 NOTE — Progress Notes (Addendum)
CSW attempted to meet with parents to offer support and assistance, but MOB was on the phone for an extended period of time.  CSW will attempt again if possible.  CSW spoke with Financial Counselor/Nita McCraw to ask that she speak to MOB about possible Medicaid eligibility.  Baby qualifies for SSI and a protective filing email has been sent, but baby will not be covered if baby passes at this time.  CSW will monitor and evaluate for appropriate time to discuss SSI eligibility with parents.   

## 2012-10-03 ENCOUNTER — Encounter (HOSPITAL_COMMUNITY)
Admission: RE | Admit: 2012-10-03 | Discharge: 2012-10-03 | Disposition: A | Discharge status: Home / Self Care | Payer: BC Managed Care – PPO | Source: Ambulatory Visit

## 2012-10-03 DIAGNOSIS — O923 Agalactia: Secondary | ICD-10-CM | POA: Insufficient documentation

## 2012-10-03 DIAGNOSIS — Z98891 History of uterine scar from previous surgery: Secondary | ICD-10-CM

## 2012-10-03 LAB — COMPREHENSIVE METABOLIC PANEL
ALT: 14 U/L (ref 0–35)
CO2: 23 mEq/L (ref 19–32)
Calcium: 8.2 mg/dL — ABNORMAL LOW (ref 8.4–10.5)
Creatinine, Ser: 0.46 mg/dL — ABNORMAL LOW (ref 0.50–1.10)
GFR calc Af Amer: >90 mL/min (ref >90)
GFR calc non Af Amer: >90 mL/min (ref >90)
Glucose, Bld: 88 mg/dL (ref 70–99)
Sodium: 139 mEq/L (ref 135–145)
Total Protein: 5.1 g/dL — ABNORMAL LOW (ref 6.0–8.3)

## 2012-10-03 LAB — CBC
MCH: 29.9 pg (ref 26.0–34.0)
MCHC: 33.6 g/dL (ref 30.0–36.0)
MCV: 89.1 fL (ref 78.0–100.0)
Platelets: 169 10*3/uL (ref 150–400)
RBC: 2.84 MIL/uL — ABNORMAL LOW (ref 3.87–5.11)

## 2012-10-03 NOTE — Progress Notes (Signed)
Patient ID: Nicole Hooper, female   DOB: 07-06-1980, 32 y.o.   MRN: [id number] Subjective: Postpartum Day 2: Cesarean Delivery secondary to: breech, 24wks, completely dilated Patient reports incisional pain, tolerating PO, + flatus, + BM and no problems voiding.  +BM no complaints, up ad lib without syncope Pain well controlled with po meds Pumping, baby "stable" in NICU, pt going to NICU frequently  Mood stable, bonding well Contraception: undecided   Objective: Vital signs in last 24 hours: Temp:  [97.8 F (36.6 C)-98.2 F (36.8 C)] 98.2 F (36.8 C) (08/30 1112) Pulse Rate:  [61-84] 83 (08/30 1112) Resp:  [16-18] 16 (08/30 1112) BP: (104-110)/(55-75) 105/67 mmHg (08/30 1112) SpO2:  [96 %-99 %] 97 % (08/30 1112)  Physical Exam:  General: alert and no distress Heart: RRR Lungs: CTAB Abdomen: BS x4 Uterine Fundus: firm Incision: healing well  Honeycomb dressing CDI Lochia: appropriate DVT Evaluation: No evidence of DVT seen on physical exam. Negative Homan's sign. No significant calf/ankle edema.   Recent Labs  10/02/12 0515 10/03/12 0535  HGB 9.0* 8.5*  HCT 26.9* 25.3*    Assessment/Plan: Status post Cesarean section. Doing well postoperatively.  Anemia without s/s  Continue current care. Plan d/c home tmrw    Nicole Hooper 10/03/2012, 3:22 PM

## 2012-10-03 NOTE — Progress Notes (Signed)
CSW spoke with MOB briefly at bedside to offer emotional support along with chaplin services while infant is in critical condition.  MOB has appropriate emotions around infant's condition and was thankful of CSW's support.  CSW will continue to follow to offer support as needed.  When infant becomes more stable CSW will complete full consult to discuss returning home with infant and SSI.  CSW does not want to discuss this with MOB while infant is in such critical condition.   [phone]

## 2012-10-03 NOTE — Lactation Note (Signed)
This note was copied from the chart of Nicole Hooper. Lactation Consultation Note [employer] rental completed. Reviewed pump setup with mom. No questions at present. To call prn  Patient Name: Nicole Rosana Farnell [id number] Date: 10/03/2012 Reason for consult: Follow-up assessment   Maternal Data    Feeding    LATCH Score/Interventions                      Lactation Tools Discussed/Used     Consult Status Consult Status: Complete    Pamelia Hoit 10/03/2012, 2:07 PM

## 2012-10-03 NOTE — Lactation Note (Signed)
This note was copied from the chart of Nicole Hooper. Lactation Consultation Note: Follow up visit with mom. Her RN called me about a pump for DC. Mom reports that she pumped 3-4 times yesterday and once this morning. Reports that breasts are feeling fuller this morning and she obtained about 20 cc's the last pumping. Encouraged to pump q 3 hours to prevent engorgement. Mom has insurance- to call them about a pump. Offered pump rental until she could get one- mom will think about it and call me if she wants pump rental. Going to NICU to visit baby. No questions at present.  Patient Name: Nicole Emilia Kayes DGLOV'F Date: 10/03/2012 Reason for consult: Follow-up assessment   Maternal Data    Feeding    LATCH Score/Interventions                      Lactation Tools Discussed/Used     Consult Status Consult Status: Follow-up Date: 10/03/12 Follow-up type: In-patient    Pamelia Hoit 10/03/2012, 9:46 AM

## 2012-10-04 MED ORDER — OXYCODONE-ACETAMINOPHEN 5-325 MG PO TABS: 1.0000 | ORAL_TABLET | ORAL | Status: DC | PRN

## 2012-10-04 MED ORDER — IBUPROFEN 600 MG PO TABS: 600.0000 mg | ORAL_TABLET | Freq: Four times a day (QID) | ORAL | Status: DC

## 2012-10-04 NOTE — Discharge Summary (Signed)
Obstetric Discharge Summary Reason for Admission: onset of labor and completely dilated, breech at 24wks  Prenatal Procedures: ultrasound Intrapartum Procedures: primary LTCS by Dr. Richardson Dopp Postpartum Procedures: none Complications-Operative and Postpartum: anemia, hemodynamically stable Hemoglobin  Date Value Range Status  10/03/2012 8.5* 12.0 - 15.0 g/dL Final     HCT  Date Value Range Status  10/03/2012 25.3* 36.0 - 46.0 % Final    Physical Exam:  General: alert and no distress Lochia: appropriate Uterine Fundus: firm Incision: healing well DVT Evaluation: No evidence of DVT seen on physical exam. Negative Homan's sign. No significant calf/ankle edema.  Discharge Diagnoses: Premature labor  Discharge Information: Date: 10/04/2012 Activity: pelvic rest Diet: routine and iron rich Medications: PNV, Ibuprofen, Colace, Iron and Percocet Condition: stable Instructions: refer to practice specific booklet Discharge to: home Follow-up Information   Follow up with Jessee Avers., MD In 2 weeks.   Specialty:  Obstetrics and Gynecology   Contact information:   [address] [place] [address] [phone]       Newborn Data: Live born female  Birth Weight: 1 lb 4.8 oz (590 g) APGAR: 1, 6  Home with mother.  Hanford Lust M 10/04/2012, 10:31 AM

## 2012-10-04 NOTE — Progress Notes (Signed)
CSW spoke with MOB again briefly in NICU to offer support.  MOB was asking questions about medicaid.  CSW discussed will follow up with coverage.  CSW does not want to begin discussing SSI while infant is so critical.  MOB reports continued appropriate emotions and CSW instructed MOB to let RN know if she needs any further emotional or resource support.    [phone] 

## 2012-10-04 NOTE — Progress Notes (Signed)
Discharge instructions reviewed with patient.  Patient states understanding of home care, medications, activity, signs/symptoms to report to MD and return MD office visit.  No home equipment needed.  Patient ambulated in stable condition for discharge with staff without incident.

## 2012-11-10 ENCOUNTER — Other Ambulatory Visit: Payer: Self-pay | Admitting: Family Medicine

## 2012-11-10 DIAGNOSIS — N63 Unspecified lump in unspecified breast: Secondary | ICD-10-CM

## 2012-11-16 ENCOUNTER — Ambulatory Visit
Admission: RE | Admit: 2012-11-16 | Discharge: 2012-11-16 | Disposition: A | Discharge status: Home / Self Care | Payer: BC Managed Care – PPO | Source: Ambulatory Visit | Attending: Family Medicine | Admitting: Family Medicine

## 2012-11-16 DIAGNOSIS — N63 Unspecified lump in unspecified breast: Secondary | ICD-10-CM

## 2013-02-12 LAB — OB RESULTS CONSOLE ABO/RH: RH TYPE: POSITIVE

## 2013-02-12 LAB — OB RESULTS CONSOLE ANTIBODY SCREEN: Antibody Screen: NEGATIVE

## 2013-02-12 LAB — OB RESULTS CONSOLE RPR: RPR: NONREACTIVE

## 2013-07-07 ENCOUNTER — Encounter: Payer: BC Managed Care – PPO | Attending: Obstetrics and Gynecology

## 2013-07-07 VITALS — Ht 66.0 in | Wt 167.8 lb

## 2013-07-07 DIAGNOSIS — O99810 Abnormal glucose complicating pregnancy: Secondary | ICD-10-CM

## 2013-07-07 DIAGNOSIS — Z713 Dietary counseling and surveillance: Secondary | ICD-10-CM | POA: Insufficient documentation

## 2013-07-07 NOTE — Progress Notes (Signed)
  Patient was seen on 07/07/13 for Gestational Diabetes self-management class at the [facility]. The following learning objectives were met by the patient during this course:   States the definition of Gestational Diabetes  States why dietary management is important in controlling blood glucose  Describes the effects of carbohydrates on blood glucose levels  Demonstrates ability to create a balanced meal plan  Demonstrates carbohydrate counting   States when to check blood glucose levels  Demonstrates proper blood glucose monitoring techniques  States the effect of stress and exercise on blood glucose levels  States the importance of limiting caffeine and abstaining from alcohol and smoking  Plan:  Aim for 2 Carb Choices per meal (30 grams) +/- 1 either way for breakfast Aim for 3 Carb Choices per meal (45 grams) +/- 1 either way from lunch and dinner Aim for 1-2 Carbs per snack Begin reading food labels for Total Carbohydrate and sugar grams of foods Consider  increasing your activity level by walking daily as tolerated Begin checking BG before breakfast and 1-2 hours after first bit of breakfast, lunch and dinner after  as directed by MD  Take medication  as directed by MD  Blood glucose monitor given:  None, provided by physician  Patient instructed to monitor glucose levels: FBS: 60 - <90 1 hour: <140 2 hour: <120  Patient received the following handouts:  Nutrition Diabetes and Pregnancy  Carbohydrate Counting List  Meal Planning worksheet  Patient will be seen for follow-up as needed.

## 2013-08-02 ENCOUNTER — Other Ambulatory Visit (HOSPITAL_COMMUNITY): Payer: Self-pay | Admitting: Obstetrics and Gynecology

## 2013-08-02 DIAGNOSIS — O24414 Gestational diabetes mellitus in pregnancy, insulin controlled: Secondary | ICD-10-CM

## 2013-08-09 ENCOUNTER — Ambulatory Visit (HOSPITAL_COMMUNITY)
Admission: RE | Admit: 2013-08-09 | Discharge: 2013-08-09 | Disposition: A | Discharge status: Home / Self Care | Payer: BC Managed Care – PPO | Source: Ambulatory Visit | Attending: Obstetrics and Gynecology | Admitting: Obstetrics and Gynecology

## 2013-08-09 DIAGNOSIS — O24414 Gestational diabetes mellitus in pregnancy, insulin controlled: Secondary | ICD-10-CM

## 2013-08-09 DIAGNOSIS — O99810 Abnormal glucose complicating pregnancy: Secondary | ICD-10-CM | POA: Insufficient documentation

## 2013-08-10 ENCOUNTER — Encounter (HOSPITAL_COMMUNITY): Payer: Self-pay | Admitting: Pharmacist

## 2013-08-20 ENCOUNTER — Other Ambulatory Visit: Payer: Self-pay | Admitting: Obstetrics and Gynecology

## 2013-08-20 NOTE — H&P (Signed)
Nicole Hooper is a 33 y.o. female  G2P0100 at 36 wks and 3 days based on LMP 12/12/2012 confirmed by 18 wk ultrasound with EDD 09/18/2013. She is presenting for repeat cesarean section given h/o classical cesarean section. . She has received Makena since 16 wks of EGA.   History OB History   Grav Para Term Preterm Abortions TAB SAB Ect Mult Living   2 1  1      1     10/01/2012 classical cesarean section at 24 wks  Due to breech presentation and preterm labor .. with Neonatal death 1-2 days after delivery     Past Medical History  Diagnosis Date  . Gestational diabetes mellitus, antepartum    Past Surgical History  Procedure Laterality Date  . Fractured foot  2006    foot surgery  . Cesarean section N/A 10/01/2012    Procedure: CESAREAN SECTION;  Surgeon: Dorien Chihuahuaara J. Richardson Doppole, MD;  Location: [facility];  Service: Obstetrics;  Laterality: N/A;   Family History: family history is not on file. Social History:  reports that she quit smoking about 3 years ago. She quit smokeless tobacco use about 3 years ago. She reports that she drinks alcohol. She reports that she does not use illicit drugs.   Prenatal Transfer Tool  Maternal Diabetes: Yes:  Diabetes Type:  Insulin/Medication controlled Genetic Screening: Declined Maternal Ultrasounds/Referrals: Normal Fetal Ultrasounds or other Referrals:  None Maternal Substance Abuse:  No Significant Maternal Medications:  Meds include: Other:  Makena   Significant Maternal Lab Results:  Lab values include: HBsAG positive Other Comments:  None  Review of Systems  All other systems reviewed and are negative.     unknown if currently breastfeeding. Exam Physical Exam  Vitals reviewed. Constitutional: She is oriented to person, place, and time. She appears well-developed and well-nourished.  HENT:  Head: Normocephalic.  Neck: Normal range of motion.  Cardiovascular: Normal rate and regular rhythm.   Respiratory: Effort normal.  GI: There is no  tenderness.  Genitourinary: Vagina normal and uterus normal.  Musculoskeletal: She exhibits no edema.  Neurological: She is alert and oriented to person, place, and time.  Skin: Skin is warm and dry.  Psychiatric: She has a normal mood and affect.    Prenatal labs: ABO, Rh: --/--/O POS, O POS (08/28 0325) Antibody: NEG (08/28 0325) Rubella:  Immune  RPR: NON REACTIVE (08/28 0325)  HBsAg:   Positive  HIV:   Nonreactive  GBS:   Pending   Assessment/Plan: 36 wks and 3 days with h/o of classical cesarean section. For repeat cesarean section R/B/A of cesarean section were discussed with the patient including but not limited to infection/ bleeding damage to bowel bladder baby with the need for further surgery. R/O trasnfusion/ HIV / Hep B&C discussed. Pt voiced understanding and desires to proceed.     Kenzi Bardwell J. 08/20/2013, 4:47 PM

## 2013-08-23 ENCOUNTER — Encounter (HOSPITAL_COMMUNITY): Payer: Self-pay

## 2013-08-23 ENCOUNTER — Encounter (HOSPITAL_COMMUNITY)
Admission: RE | Admit: 2013-08-23 | Discharge: 2013-08-23 | Disposition: A | Discharge status: Home / Self Care | Payer: BC Managed Care – PPO | Source: Ambulatory Visit | Attending: Obstetrics and Gynecology | Admitting: Obstetrics and Gynecology

## 2013-08-23 HISTORY — DX: Depression, unspecified: F32.A

## 2013-08-23 HISTORY — DX: Anemia, unspecified: D64.9

## 2013-08-23 HISTORY — DX: Major depressive disorder, single episode, unspecified: F32.9

## 2013-08-23 LAB — BASIC METABOLIC PANEL
Anion gap: 12 (ref 5–15)
BUN: 6 mg/dL (ref 6–23)
CHLORIDE: 102 meq/L (ref 96–112)
CO2: 20 meq/L (ref 19–32)
Calcium: 9.2 mg/dL (ref 8.4–10.5)
Creatinine, Ser: 0.44 mg/dL — ABNORMAL LOW (ref 0.50–1.10)
GFR calc Af Amer: >90 mL/min (ref >90)
GFR calc non Af Amer: >90 mL/min (ref >90)
Glucose, Bld: 78 mg/dL (ref 70–99)
Potassium: 3.9 mEq/L (ref 3.7–5.3)
Sodium: 134 mEq/L — ABNORMAL LOW (ref 137–147)

## 2013-08-23 LAB — CBC
HCT: 34.7 % — ABNORMAL LOW (ref 36.0–46.0)
Hemoglobin: 11.5 g/dL — ABNORMAL LOW (ref 12.0–15.0)
MCH: 31.6 pg (ref 26.0–34.0)
MCHC: 33.1 g/dL (ref 30.0–36.0)
MCV: 95.3 fL (ref 78.0–100.0)
PLATELETS: 193 10*3/uL (ref 150–400)
RBC: 3.64 MIL/uL — ABNORMAL LOW (ref 3.87–5.11)
RDW: 13.7 % (ref 11.5–15.5)
WBC: 10.7 10*3/uL — AB (ref 4.0–10.5)

## 2013-08-23 LAB — RPR

## 2013-08-23 NOTE — Patient Instructions (Addendum)
   Your procedure is scheduled on: Tuesday, July 21  [facility] at: 6 AM Pick up the phone at the desk and dial [phone] and inform us of your arrival.  Please call this number if you have any problems the morning of surgery: [phone]  Remember: Do not eat or drink after midnight: Monday Take these medicines the morning of surgery with a SIP OF WATER:  Do not wear jewelry, make-up, or FINGER nail polish No metal in your hair or on your body. Do not wear lotions, powders, perfumes.  You may wear deodorant.  Do not bring valuables to the hospital. Contacts, dentures or bridgework may not be worn into surgery.  Leave suitcase in the car. After Surgery it may be brought to your room. For patients being admitted to the hospital, checkout time is 11:00am the day of discharge.  Home with mother Darryl Lent- Diem cell [phone]

## 2013-08-23 NOTE — Patient Instructions (Addendum)
   Your procedure is scheduled on: Tuesday, July 21  [facility] at: 6 AM Pick up the phone at the desk and dial [phone] and inform us of your arrival.  Please call this number if you have any problems the morning of surgery: [phone]  Remember: Do not eat or drink after midnight: Monday Take these medicines the morning of surgery with a SIP OF WATER:  None.  Patient instructed not to take glyburide on Tuesday, day of surgery.  Do not wear jewelry, make-up, or FINGER nail polish No metal in your hair or on your body. Do not wear lotions, powders, perfumes.  You may wear deodorant.  Do not bring valuables to the hospital. Contacts, dentures or bridgework may not be worn into surgery.  Leave suitcase in the car. After Surgery it may be brought to your room. For patients being admitted to the hospital, checkout time is 11:00am the day of discharge.  Home with mother Darryl Lent- Diem cell [phone]

## 2013-08-24 ENCOUNTER — Encounter (HOSPITAL_COMMUNITY)
Admission: RE | Disposition: A | Discharge status: Home / Self Care | Payer: Self-pay | Source: Ambulatory Visit | Attending: Obstetrics and Gynecology

## 2013-08-24 ENCOUNTER — Encounter (HOSPITAL_COMMUNITY): Payer: Self-pay

## 2013-08-24 ENCOUNTER — Inpatient Hospital Stay (HOSPITAL_COMMUNITY)
Admission: RE | Admit: 2013-08-24 | Discharge: 2013-08-27 | DRG: 765 | Disposition: A | Discharge status: Home / Self Care | Payer: BC Managed Care – PPO | Source: Ambulatory Visit | Attending: Obstetrics and Gynecology | Admitting: Obstetrics and Gynecology

## 2013-08-24 ENCOUNTER — Inpatient Hospital Stay (HOSPITAL_COMMUNITY): Payer: BC Managed Care – PPO | Admitting: Anesthesiology

## 2013-08-24 ENCOUNTER — Encounter (HOSPITAL_COMMUNITY): Payer: BC Managed Care – PPO | Admitting: Anesthesiology

## 2013-08-24 DIAGNOSIS — Z87891 Personal history of nicotine dependence: Secondary | ICD-10-CM

## 2013-08-24 DIAGNOSIS — O34219 Maternal care for unspecified type scar from previous cesarean delivery: Principal | ICD-10-CM | POA: Diagnosis present

## 2013-08-24 DIAGNOSIS — Z98891 History of uterine scar from previous surgery: Secondary | ICD-10-CM

## 2013-08-24 DIAGNOSIS — O99814 Abnormal glucose complicating childbirth: Secondary | ICD-10-CM | POA: Diagnosis present

## 2013-08-24 LAB — GLUCOSE, CAPILLARY: GLUCOSE-CAPILLARY: 93 mg/dL (ref 70–99)

## 2013-08-24 LAB — PREPARE RBC (CROSSMATCH)

## 2013-08-24 SURGERY — Surgical Case
Anesthesia: Spinal

## 2013-08-24 MED ORDER — OXYTOCIN 40 UNITS IN LACTATED RINGERS INFUSION - SIMPLE MED: 62.5000 mL/h | INTRAVENOUS | Status: AC

## 2013-08-24 MED ORDER — FENTANYL CITRATE 0.05 MG/ML IJ SOLN
INTRAMUSCULAR | Status: AC
  Filled 2013-08-24: qty 2

## 2013-08-24 MED ORDER — DIPHENHYDRAMINE HCL 50 MG/ML IJ SOLN
INTRAMUSCULAR | Status: AC
  Filled 2013-08-24: qty 1

## 2013-08-24 MED ORDER — MENTHOL 3 MG MT LOZG: 1.0000 | LOZENGE | OROMUCOSAL | Status: DC | PRN

## 2013-08-24 MED ORDER — ONDANSETRON HCL 4 MG/2ML IJ SOLN: 4.0000 mg | INTRAMUSCULAR | Status: DC | PRN

## 2013-08-24 MED ORDER — SCOPOLAMINE 1 MG/3DAYS TD PT72
MEDICATED_PATCH | TRANSDERMAL | Status: AC
  Filled 2013-08-24: qty 1

## 2013-08-24 MED ORDER — IBUPROFEN 600 MG PO TABS: 600.0000 mg | ORAL_TABLET | Freq: Four times a day (QID) | ORAL | Status: DC | PRN

## 2013-08-24 MED ORDER — PRENATAL MULTIVITAMIN CH
1.0000 | ORAL_TABLET | Freq: Every day | ORAL | Status: DC
  Administered 2013-08-26: 1 via ORAL
  Filled 2013-08-24: qty 1

## 2013-08-24 MED ORDER — ONDANSETRON HCL 4 MG PO TABS: 4.0000 mg | ORAL_TABLET | ORAL | Status: DC | PRN

## 2013-08-24 MED ORDER — BUPIVACAINE IN DEXTROSE 0.75-8.25 % IT SOLN
INTRATHECAL | Status: DC | PRN
Start: 2013-08-24 — End: 2013-08-24
  Administered 2013-08-24: 1.4 mL via INTRATHECAL

## 2013-08-24 MED ORDER — SIMETHICONE 80 MG PO CHEW: 80.0000 mg | CHEWABLE_TABLET | ORAL | Status: DC | PRN

## 2013-08-24 MED ORDER — 0.9 % SODIUM CHLORIDE (POUR BTL) OPTIME
TOPICAL | Status: DC | PRN
  Administered 2013-08-24: 1000 mL

## 2013-08-24 MED ORDER — METOCLOPRAMIDE HCL 5 MG/ML IJ SOLN: 10.0000 mg | Freq: Three times a day (TID) | INTRAMUSCULAR | Status: DC | PRN

## 2013-08-24 MED ORDER — KETOROLAC TROMETHAMINE 30 MG/ML IJ SOLN
30.0000 mg | Freq: Four times a day (QID) | INTRAMUSCULAR | Status: DC | PRN
  Administered 2013-08-24: 30 mg via INTRAMUSCULAR

## 2013-08-24 MED ORDER — MEPERIDINE HCL 25 MG/ML IJ SOLN: 6.2500 mg | INTRAMUSCULAR | Status: DC | PRN

## 2013-08-24 MED ORDER — FENTANYL CITRATE 0.05 MG/ML IJ SOLN
25.0000 ug | INTRAMUSCULAR | Status: DC | PRN
  Administered 2013-08-24: 25 ug via INTRAVENOUS

## 2013-08-24 MED ORDER — LACTATED RINGERS IV SOLN
INTRAVENOUS | Status: DC | PRN
  Administered 2013-08-24 (×2): via INTRAVENOUS

## 2013-08-24 MED ORDER — SENNOSIDES-DOCUSATE SODIUM 8.6-50 MG PO TABS
2.0000 | ORAL_TABLET | ORAL | Status: DC
  Administered 2013-08-24 – 2013-08-26 (×3): 2 via ORAL
  Filled 2013-08-24 (×3): qty 2

## 2013-08-24 MED ORDER — DIBUCAINE 1 % RE OINT: 1.0000 "application " | TOPICAL_OINTMENT | RECTAL | Status: DC | PRN

## 2013-08-24 MED ORDER — CEFAZOLIN SODIUM-DEXTROSE 2-3 GM-% IV SOLR
INTRAVENOUS | Status: AC
  Administered 2013-08-24: 2 g via INTRAVENOUS
  Filled 2013-08-24: qty 50

## 2013-08-24 MED ORDER — EPHEDRINE 5 MG/ML INJ
INTRAVENOUS | Status: AC
  Filled 2013-08-24: qty 10

## 2013-08-24 MED ORDER — DIPHENHYDRAMINE HCL 50 MG/ML IJ SOLN: 25.0000 mg | INTRAMUSCULAR | Status: DC | PRN

## 2013-08-24 MED ORDER — LANOLIN HYDROUS EX OINT: 1.0000 "application " | TOPICAL_OINTMENT | CUTANEOUS | Status: DC | PRN

## 2013-08-24 MED ORDER — SODIUM CHLORIDE 0.9 % IJ SOLN: 3.0000 mL | INTRAMUSCULAR | Status: DC | PRN

## 2013-08-24 MED ORDER — PHENYLEPHRINE 8 MG IN D5W 100 ML (0.08MG/ML) PREMIX OPTIME
INJECTION | INTRAVENOUS | Status: AC
  Filled 2013-08-24: qty 100

## 2013-08-24 MED ORDER — ZOLPIDEM TARTRATE 5 MG PO TABS: 5.0000 mg | ORAL_TABLET | Freq: Every evening | ORAL | Status: DC | PRN

## 2013-08-24 MED ORDER — ONDANSETRON HCL 4 MG/2ML IJ SOLN
INTRAMUSCULAR | Status: AC
  Filled 2013-08-24: qty 2

## 2013-08-24 MED ORDER — DIPHENHYDRAMINE HCL 25 MG PO CAPS
25.0000 mg | ORAL_CAPSULE | ORAL | Status: DC | PRN
  Filled 2013-08-24: qty 1

## 2013-08-24 MED ORDER — LACTATED RINGERS IV SOLN: INTRAVENOUS | Status: DC

## 2013-08-24 MED ORDER — ONDANSETRON HCL 4 MG/2ML IJ SOLN: 4.0000 mg | Freq: Three times a day (TID) | INTRAMUSCULAR | Status: DC | PRN

## 2013-08-24 MED ORDER — KETOROLAC TROMETHAMINE 30 MG/ML IJ SOLN: 15.0000 mg | Freq: Once | INTRAMUSCULAR | Status: DC | PRN

## 2013-08-24 MED ORDER — CEFAZOLIN SODIUM-DEXTROSE 2-3 GM-% IV SOLR: 2.0000 g | INTRAVENOUS | Status: DC

## 2013-08-24 MED ORDER — IBUPROFEN 600 MG PO TABS
600.0000 mg | ORAL_TABLET | Freq: Four times a day (QID) | ORAL | Status: DC
Start: 2013-08-24 — End: 2013-08-27
  Administered 2013-08-24 – 2013-08-27 (×11): 600 mg via ORAL
  Filled 2013-08-24 (×11): qty 1

## 2013-08-24 MED ORDER — OXYTOCIN 10 UNIT/ML IJ SOLN
INTRAMUSCULAR | Status: DC | PRN
  Administered 2013-08-24: 40 [IU] via INTRAMUSCULAR

## 2013-08-24 MED ORDER — SIMETHICONE 80 MG PO CHEW
80.0000 mg | CHEWABLE_TABLET | Freq: Three times a day (TID) | ORAL | Status: DC
  Administered 2013-08-24 – 2013-08-27 (×8): 80 mg via ORAL
  Filled 2013-08-24 (×7): qty 1

## 2013-08-24 MED ORDER — OXYTOCIN 10 UNIT/ML IJ SOLN
INTRAMUSCULAR | Status: AC
  Filled 2013-08-24: qty 4

## 2013-08-24 MED ORDER — PROMETHAZINE HCL 25 MG/ML IJ SOLN: 6.2500 mg | INTRAMUSCULAR | Status: DC | PRN

## 2013-08-24 MED ORDER — FENTANYL CITRATE 0.05 MG/ML IJ SOLN
INTRAMUSCULAR | Status: DC | PRN
  Administered 2013-08-24: 12.5 ug via INTRATHECAL

## 2013-08-24 MED ORDER — NALOXONE HCL 1 MG/ML IJ SOLN: 1.0000 ug/kg/h | INTRAVENOUS | Status: DC | PRN

## 2013-08-24 MED ORDER — MORPHINE SULFATE 0.5 MG/ML IJ SOLN
INTRAMUSCULAR | Status: AC
Start: 2013-08-24 — End: 2013-08-24
  Filled 2013-08-24: qty 10

## 2013-08-24 MED ORDER — METHYLERGONOVINE MALEATE 0.2 MG/ML IJ SOLN: 0.2000 mg | INTRAMUSCULAR | Status: DC | PRN

## 2013-08-24 MED ORDER — MORPHINE SULFATE (PF) 0.5 MG/ML IJ SOLN
INTRAMUSCULAR | Status: DC | PRN
Start: 2013-08-24 — End: 2013-08-24
  Administered 2013-08-24: .2 mg via EPIDURAL

## 2013-08-24 MED ORDER — NALOXONE HCL 0.4 MG/ML IJ SOLN: 0.4000 mg | INTRAMUSCULAR | Status: DC | PRN

## 2013-08-24 MED ORDER — FERROUS SULFATE 325 (65 FE) MG PO TABS
325.0000 mg | ORAL_TABLET | Freq: Two times a day (BID) | ORAL | Status: DC
  Administered 2013-08-24 – 2013-08-27 (×6): 325 mg via ORAL
  Filled 2013-08-24 (×7): qty 1

## 2013-08-24 MED ORDER — CEFAZOLIN SODIUM-DEXTROSE 2-3 GM-% IV SOLR
INTRAVENOUS | Status: AC
  Filled 2013-08-24: qty 50

## 2013-08-24 MED ORDER — DIPHENHYDRAMINE HCL 50 MG/ML IJ SOLN
12.5000 mg | INTRAMUSCULAR | Status: DC | PRN
  Administered 2013-08-24: 12.5 mg via INTRAVENOUS

## 2013-08-24 MED ORDER — ONDANSETRON HCL 4 MG/2ML IJ SOLN
INTRAMUSCULAR | Status: DC | PRN
  Administered 2013-08-24: 4 mg via INTRAVENOUS

## 2013-08-24 MED ORDER — METHYLERGONOVINE MALEATE 0.2 MG PO TABS: 0.2000 mg | ORAL_TABLET | ORAL | Status: DC | PRN

## 2013-08-24 MED ORDER — SCOPOLAMINE 1 MG/3DAYS TD PT72
1.0000 | MEDICATED_PATCH | Freq: Once | TRANSDERMAL | Status: DC
  Administered 2013-08-24: 1.5 mg via TRANSDERMAL

## 2013-08-24 MED ORDER — DIPHENHYDRAMINE HCL 25 MG PO CAPS
25.0000 mg | ORAL_CAPSULE | Freq: Four times a day (QID) | ORAL | Status: DC | PRN
  Administered 2013-08-24: 25 mg via ORAL
  Filled 2013-08-24: qty 1

## 2013-08-24 MED ORDER — KETOROLAC TROMETHAMINE 30 MG/ML IJ SOLN: 30.0000 mg | Freq: Four times a day (QID) | INTRAMUSCULAR | Status: DC | PRN

## 2013-08-24 MED ORDER — NALBUPHINE HCL 10 MG/ML IJ SOLN: 5.0000 mg | INTRAMUSCULAR | Status: DC | PRN

## 2013-08-24 MED ORDER — KETOROLAC TROMETHAMINE 30 MG/ML IJ SOLN
INTRAMUSCULAR | Status: AC
  Filled 2013-08-24: qty 1

## 2013-08-24 MED ORDER — LACTATED RINGERS IV SOLN
INTRAVENOUS | Status: DC | PRN
  Administered 2013-08-24 (×2): via INTRAVENOUS

## 2013-08-24 MED ORDER — LACTATED RINGERS IV SOLN
Freq: Once | INTRAVENOUS | Status: AC
  Administered 2013-08-24: 1000 mL via INTRAVENOUS

## 2013-08-24 MED ORDER — OXYCODONE-ACETAMINOPHEN 5-325 MG PO TABS
1.0000 | ORAL_TABLET | ORAL | Status: DC | PRN
  Administered 2013-08-24 – 2013-08-25 (×2): 1 via ORAL
  Administered 2013-08-25: 2 via ORAL
  Administered 2013-08-25 (×4): 1 via ORAL
  Administered 2013-08-26: 2 via ORAL
  Administered 2013-08-26 – 2013-08-27 (×5): 1 via ORAL
  Filled 2013-08-24: qty 2
  Filled 2013-08-24 (×9): qty 1
  Filled 2013-08-24 (×2): qty 2
  Filled 2013-08-24 (×2): qty 1

## 2013-08-24 MED ORDER — SIMETHICONE 80 MG PO CHEW
80.0000 mg | CHEWABLE_TABLET | ORAL | Status: DC
Start: 2013-08-25 — End: 2013-08-27
  Administered 2013-08-24 – 2013-08-26 (×3): 80 mg via ORAL
  Filled 2013-08-24 (×3): qty 1

## 2013-08-24 MED ORDER — PHENYLEPHRINE 8 MG IN D5W 100 ML (0.08MG/ML) PREMIX OPTIME
INJECTION | INTRAVENOUS | Status: DC | PRN
  Administered 2013-08-24: 60 ug/min via INTRAVENOUS

## 2013-08-24 MED ORDER — WITCH HAZEL-GLYCERIN EX PADS: 1.0000 "application " | MEDICATED_PAD | CUTANEOUS | Status: DC | PRN

## 2013-08-24 SURGICAL SUPPLY — 39 items
BARRIER ADHS 3X4 INTERCEED (GAUZE/BANDAGES/DRESSINGS) ×2 IMPLANT
BENZOIN TINCTURE PRP APPL 2/3 (GAUZE/BANDAGES/DRESSINGS) ×2 IMPLANT
CLAMP CORD UMBIL (MISCELLANEOUS) IMPLANT
CLOTH BEACON ORANGE TIMEOUT ST (SAFETY) ×2 IMPLANT
CONTAINER PREFILL 10% NBF 15ML (MISCELLANEOUS) IMPLANT
DRAPE LG THREE QUARTER DISP (DRAPES) IMPLANT
DRSG OPSITE POSTOP 4X10 (GAUZE/BANDAGES/DRESSINGS) ×2 IMPLANT
DURAPREP 26ML APPLICATOR (WOUND CARE) ×2 IMPLANT
ELECT REM PT RETURN 9FT ADLT (ELECTROSURGICAL) ×2
ELECTRODE REM PT RTRN 9FT ADLT (ELECTROSURGICAL) ×1 IMPLANT
EXTRACTOR VACUUM M CUP 4 TUBE (SUCTIONS) IMPLANT
GLOVE BIOGEL M 6.5 STRL (GLOVE) ×4 IMPLANT
GLOVE BIOGEL PI IND STRL 6.5 (GLOVE) ×1 IMPLANT
GLOVE BIOGEL PI INDICATOR 6.5 (GLOVE) ×1
GOWN STRL REUS W/TWL LRG LVL3 (GOWN DISPOSABLE) ×6 IMPLANT
KIT ABG SYR 3ML LUER SLIP (SYRINGE) IMPLANT
NEEDLE HYPO 25X5/8 SAFETYGLIDE (NEEDLE) IMPLANT
NS IRRIG 1000ML POUR BTL (IV SOLUTION) ×2 IMPLANT
PACK C SECTION WH (CUSTOM PROCEDURE TRAY) ×2 IMPLANT
PAD ABD 8X7 1/2 STERILE (GAUZE/BANDAGES/DRESSINGS) ×2 IMPLANT
PAD OB MATERNITY 4.3X12.25 (PERSONAL CARE ITEMS) ×2 IMPLANT
RTRCTR C-SECT PINK 25CM LRG (MISCELLANEOUS) ×2 IMPLANT
RTRCTR C-SECT PINK 34CM XLRG (MISCELLANEOUS) IMPLANT
SPONGE GAUZE 4X4 12PLY STER LF (GAUZE/BANDAGES/DRESSINGS) ×2 IMPLANT
STAPLER VISISTAT 35W (STAPLE) IMPLANT
STRIP CLOSURE SKIN 1/2X4 (GAUZE/BANDAGES/DRESSINGS) ×2 IMPLANT
SUT PDS AB 0 CT1 27 (SUTURE) ×4 IMPLANT
SUT PLAIN 0 NONE (SUTURE) IMPLANT
SUT VIC AB 0 CTX 36 (SUTURE) ×3
SUT VIC AB 0 CTX36XBRD ANBCTRL (SUTURE) ×3 IMPLANT
SUT VIC AB 2-0 CT1 27 (SUTURE) ×2
SUT VIC AB 2-0 CT1 TAPERPNT 27 (SUTURE) ×2 IMPLANT
SUT VIC AB 3-0 SH 27 (SUTURE)
SUT VIC AB 3-0 SH 27X BRD (SUTURE) IMPLANT
SUT VIC AB 4-0 KS 27 (SUTURE) ×2 IMPLANT
TAPE CLOTH SURG 4X10 WHT LF (GAUZE/BANDAGES/DRESSINGS) ×2 IMPLANT
TOWEL OR 17X24 6PK STRL BLUE (TOWEL DISPOSABLE) ×2 IMPLANT
TRAY FOLEY CATH 14FR (SET/KITS/TRAYS/PACK) ×2 IMPLANT
WATER STERILE IRR 1000ML POUR (IV SOLUTION) ×2 IMPLANT

## 2013-08-24 NOTE — H&P (Signed)
Date of Initial H&P: 08/20/2013 History reviewed, patient examined, no change in status, stable for surgery. Discussed with patient r/o accreta with possible need for transfusion and cesarean hysterectomy. Pt voiced understanding and desires to proceed.

## 2013-08-24 NOTE — Anesthesia Postprocedure Evaluation (Signed)
  Anesthesia Post Note  Patient: Nicole Hooper  Procedure(s) Performed: Procedure(s) (LRB): REPEAT CESAREAN SECTION (N/A)  Anesthesia type: Spinal  Patient location: PACU  Post pain: Pain level controlled  Post assessment: Post-op Vital signs reviewed  Last Vitals:  Filed Vitals:   08/24/13 0856  BP: 113/66  Pulse: 80  Temp: 36.9 C  Resp: 23    Post vital signs: Reviewed  Level of consciousness: awake  Complications: No apparent anesthesia complications

## 2013-08-24 NOTE — Addendum Note (Signed)
Addendum created 08/24/13 1416 by Shanon PayorSuzanne M Maryum Batterson, CRNA   Modules edited: Notes Section   Notes Section:  File: [id number]

## 2013-08-24 NOTE — Anesthesia Preprocedure Evaluation (Signed)
Anesthesia Evaluation  Patient identified by MRN, date of birth, ID band Patient awake    Reviewed: Allergy & Precautions, H&P , NPO status , Patient's Chart, lab work & pertinent test results  Airway Mallampati: I TM Distance: >3 FB Neck ROM: full    Dental no notable dental hx.    Pulmonary former smoker,    Pulmonary exam normal       Cardiovascular negative cardio ROS      Neuro/Psych negative neurological ROS     GI/Hepatic negative GI ROS, Neg liver ROS,   Endo/Other  diabetes, Gestational  Renal/GU negative Renal ROS     Musculoskeletal   Abdominal Normal abdominal exam  (+)   Peds  Hematology negative hematology ROS (+)   Anesthesia Other Findings   Reproductive/Obstetrics (+) Pregnancy                           Anesthesia Physical Anesthesia Plan  ASA: II  Anesthesia Plan: Spinal   Post-op Pain Management:    Induction:   Airway Management Planned:   Additional Equipment:   Intra-op Plan:   Post-operative Plan:   Informed Consent: I have reviewed the patients History and Physical, chart, labs and discussed the procedure including the risks, benefits and alternatives for the proposed anesthesia with the patient or authorized representative who has indicated his/her understanding and acceptance.     Plan Discussed with: CRNA and Surgeon  Anesthesia Plan Comments:         Anesthesia Quick Evaluation

## 2013-08-24 NOTE — Transfer of Care (Signed)
Immediate Anesthesia Transfer of Care Note  Patient: Nicole Hooper  Procedure(s) Performed: Procedure(s): REPEAT CESAREAN SECTION (N/A)  Patient Location: PACU  Anesthesia Type:Spinal  Level of Consciousness: awake, alert , oriented and patient cooperative  Airway & Oxygen Therapy: Patient Spontanous Breathing  Post-op Assessment: Report given to PACU RN and Post -op Vital signs reviewed and stable  Post vital signs: Reviewed and stable  Complications: No apparent anesthesia complications

## 2013-08-24 NOTE — Anesthesia Postprocedure Evaluation (Signed)
  Anesthesia Post-op Note  Patient: Nicole Hooper  Procedure(s) Performed: Procedure(s): REPEAT CESAREAN SECTION (N/A)  Patient Location: Mother/Baby  Anesthesia Type:Spinal  Level of Consciousness: awake, alert  and oriented  Airway and Oxygen Therapy: Patient Spontanous Breathing  Post-op Pain: none  Post-op Assessment: Post-op Vital signs reviewed, Patient's Cardiovascular Status Stable, Patent Airway, No headache, No backache, No residual numbness and No residual motor weakness  Post-op Vital Signs: Reviewed and stable  Last Vitals:  Filed Vitals:   08/24/13 1300  BP: 106/63  Pulse: 87  Temp: 36.6 C  Resp: 18    Complications: No apparent anesthesia complications

## 2013-08-24 NOTE — Anesthesia Procedure Notes (Signed)
Spinal  Patient location during procedure: OR Start time: 08/24/2013 7:34 AM End time: 08/24/2013 7:36 AM Staffing Anesthesiologist: Leilani AbleHATCHETT, FRANKLIN Performed by: anesthesiologist  Preanesthetic Checklist Completed: patient identified, surgical consent, pre-op evaluation, timeout performed, IV checked, risks and benefits discussed and monitors and equipment checked Spinal Block Patient position: sitting Prep: site prepped and draped and DuraPrep Patient monitoring: heart rate, cardiac monitor, continuous pulse ox and blood pressure Approach: midline Location: L3-4 Injection technique: single-shot Needle Needle type: Pencan  Needle gauge: 24 G Needle length: 9 cm Needle insertion depth: 5 cm Assessment Sensory level: T4

## 2013-08-24 NOTE — Op Note (Signed)
Cesaran Section Procedure Note  Indications: previous uterine incision classical  Pre-operative Diagnosis: 36 week 3 day pregnancy.  Post-operative Diagnosis: same  Surgeon: Jessee AversOLE,Jakala Herford J.   Assistants: Dr. Myna HidalgoJennifer Ozan assisted due to concern for pelvic adhesions and possible accreta given h/o classical cesarean section.   Anesthesia: Spinal anesthesia  ASA Class: 2   Procedure Details   The patient was seen in the Holding Room. The risks, benefits, complications, treatment options, and expected outcomes were discussed with the patient.  The patient concurred with the proposed plan, giving informed consent.  The site of surgery properly noted/marked. The patient was taken to Operating Room # 9, identified as Nicholaus CorollaMai N Evinger and the procedure verified as C-Section Delivery. A Time Out was held and the above information confirmed.  After induction of anesthesia, the patient was draped and prepped in the usual sterile manner. A Pfannenstiel incision was made and carried down through the subcutaneous tissue to the fascia. Fascial incision was made and extended transversely. The fascia was separated from the underlying rectus tissue superiorly and inferiorly. The peritoneum was identified and entered. Peritoneal incision was extended longitudinally. The utero-vesical peritoneal reflection was incised transversely and the bladder flap was bluntly freed from the lower uterine segment. A low transverse uterine incision was made. Delivered from cephalic presentation was a  Female with Apgar scores of 9 at one minute and 9 at five minutes. After the umbilical cord was clamped and cut cord blood was obtained for evaluation. The placenta was removed intact and appeared normal. The uterine outline, tubes and ovaries appeared normal. The uterine incision was closed with running locked sutures of 0 vicryl a second layer of 0 vicryl was used to imbricate the incision . Hemostasis was observed. Lavage was carried out  until clear. Interseed was placed along the lower uterine segment incision. The fascia was then reapproximated with running sutures of 0 pds .  Scarpas fascia was reapproximated with 2-0 vicryl. The skin was reapproximated with 4-0 vicryl .  Instrument, sponge, and needle counts were correct prior the abdominal closure and at the conclusion of the case.   Findings: Adhesions of the bladder to lower uterine segment...  Very thin  Uterine window in the lower uterine segment.  Normal fallopian tubes and ovaries.  ... Female infant in the cephalic presentation.   Estimated Blood Loss:  500 ml         Drains: Foley          Total IV Fluids:  Per anesthesia ml         Specimens: Placenta and  to labor and delivery           Implants: none         Complications:  None; patient tolerated the procedure well.         Disposition: PACU - hemodynamically stable.         Condition: stable  Attending Attestation: I performed the procedure.

## 2013-08-25 ENCOUNTER — Encounter (HOSPITAL_COMMUNITY): Payer: Self-pay | Admitting: Obstetrics and Gynecology

## 2013-08-25 LAB — CBC
HEMATOCRIT: 30.7 % — AB (ref 36.0–46.0)
HEMOGLOBIN: 10.2 g/dL — AB (ref 12.0–15.0)
MCH: 31.7 pg (ref 26.0–34.0)
MCHC: 33.2 g/dL (ref 30.0–36.0)
MCV: 95.3 fL (ref 78.0–100.0)
Platelets: 166 10*3/uL (ref 150–400)
RBC: 3.22 MIL/uL — AB (ref 3.87–5.11)
RDW: 13.6 % (ref 11.5–15.5)
WBC: 12.2 10*3/uL — ABNORMAL HIGH (ref 4.0–10.5)

## 2013-08-25 LAB — GLUCOSE, CAPILLARY: Glucose-Capillary: 81 mg/dL (ref 70–99)

## 2013-08-25 NOTE — Progress Notes (Signed)
Subjective: Postpartum Day 1: Cesarean Delivery Patient reports incisional pain, tolerating PO, + flatus and no problems voiding.    Objective: Vital signs in last 24 hours: Temp:  [97.4 F (36.3 C)-98.8 F (37.1 C)] 97.4 F (36.3 C) (07/22 0400) Pulse Rate:  [61-87] 61 (07/22 0400) Resp:  [15-22] 18 (07/22 0400) BP: (93-108)/(60-70) 99/61 mmHg (07/22 0400) SpO2:  [95 %-100 %] 97 % (07/22 0400)  Physical Exam:  General: alert and cooperative Lochia: appropriate Uterine Fundus: firm Incision: bandage clean dry intact  DVT Evaluation: No evidence of DVT seen on physical exam.   Recent Labs  08/23/13 1130 08/25/13 0544  HGB 11.5* 10.2*  HCT 34.7* 30.7*    Assessment/Plan: Status post Cesarean section. Doing well postoperatively.  Continue current care  Encouraged ambulation  Pt advised to remove her bandage when she showers today.  She desires circumcision of infant. R/b/a discussed with patient. Jessee Avers.  Aashika Carta J. 08/25/2013, 9:58 AM

## 2013-08-25 NOTE — Lactation Note (Signed)
This note was copied from the chart of Nicole Hooper Dugo. Lactation Consultation Note: Mom has been bottle feeding today. Has not pumped or put the baby to the breast since last evening. Offered assist with latch but states he is sleepy and she does not want to wake him. Reports he is doing well with sucking on the bottle. Encouraged to pump q 3 hours to promote a good milk supply if she wants to breast feed. Reports she has a Medela pump at home. No questions at present. To call for assist prn  Patient Name: Nicole Hooper [name] Date: 08/25/2013 Reason for consult: Follow-up assessment   Maternal Data Formula Feeding for Exclusion: Yes Reason for exclusion: Mother's choice to formula and breast feed on admission Infant to breast within first hour of birth: No  Feeding Feeding Type: Formula  LATCH Score/Interventions                      Lactation Tools Discussed/Used     Consult Status Consult Status: Follow-up Date: 08/26/13 Follow-up type: In-patient    Pamelia HoitWeeks, Alfonzo Arca D 08/25/2013, 2:30 PM

## 2013-08-26 MED ORDER — HYDROCORTISONE 1 % EX CREA
TOPICAL_CREAM | CUTANEOUS | Status: DC | PRN
  Filled 2013-08-26: qty 28

## 2013-08-26 MED ORDER — DIPHENHYDRAMINE HCL 25 MG PO CAPS
50.0000 mg | ORAL_CAPSULE | Freq: Four times a day (QID) | ORAL | Status: DC | PRN
  Administered 2013-08-26: 50 mg via ORAL
  Filled 2013-08-26: qty 2

## 2013-08-26 NOTE — Lactation Note (Signed)
This note was copied from the chart of Nicole Hooper Welcher. Lactation Consultation Note     Follow up consult with this mom of a late pre term baby, now 36 5/7 weeks CGA, and at 53 hours post partum. Mom has been pumping with DEP every 3 hours, and her milk is now transitioning in. She was able to express 5 mls with last pumping, which was fed to baby by bottle and at breast with nipple shield. Mom has nipples which flatten, and baby would not maintain latch. i also noted an upper lip tie that extends to the gum line, and a mid tongue frenulum that appears short, with a bowl shaped tongue with elevation. His tongue does not appear to go beyond his bottom gum line. I briefly showed this to mom on her baby, and for now explained thaast this may limit his tongue movement, and a nipple shield with help him maintain a latch and transfer milk. I showed mom how to apply shield and how to hand expres. Mom's breast very tender - even shilel application difficult. Mom encouraged to keep pumping every 3 hours, review of how having a LPT baby effects breast feeding/transfer of milk. Mom knows to call for RN/lactation hlep with next feeding.  Patient Name: Nicole Hooper [place] ZOXWR'UToday's Date: 08/26/2013 Reason for consult: Follow-up assessment;Infant < 6lbs   Maternal Data    Feeding Feeding Type: Breast Milk Length of feed: 15 min  LATCH Score/Interventions Latch: Repeated attempts needed to sustain latch, nipple held in mouth throughout feeding, stimulation needed to elicit sucking reflex. (baby eager to latch, wide mouth, flat nipples 0- used 24 nipple shiled, baby iwth short tongue frenulum, and upper lip tie to gum line) Intervention(s): Adjust position;Assist with latch;Breast compression  Audible Swallowing: A few with stimulation  Type of Nipple: Flat (24 nipple shiled) Intervention(s): Double electric pump  Comfort (Breast/Nipple): Filling, red/small blisters or bruises, mild/mod discomfort  Problem noted:  Filling;Mild/Moderate discomfort  Hold (Positioning): Assistance needed to correctly position infant at breast and maintain latch. Intervention(s): Breastfeeding basics reviewed;Support Pillows;Position options;Skin to skin  LATCH Score: 5  Lactation Tools Discussed/Used Tools: Pump Breast pump type: Double-Electric Breast Pump Pump Review: Setup, frequency, and cleaning;Milk Storage   Consult Status Consult Status: Follow-up Date: 08/27/13 Follow-up type: In-patient    Alfred LevinsLee, Ronelle Smallman Anne 08/26/2013, 2:02 PM

## 2013-08-26 NOTE — Progress Notes (Signed)
Subjective: Postpartum Day 2: Cesarean Delivery Patient reports incisional pain, tolerating PO, + flatus and no problems voiding.    Objective: Vital signs in last 24 hours: Temp:  [97.5 F (36.4 C)-98 F (36.7 C)] 98 F (36.7 C) (07/23 0554) Pulse Rate:  [67-86] 86 (07/23 0554) Resp:  [18] 18 (07/23 0554) BP: (113-117)/(76-79) 117/76 mmHg (07/23 0554) SpO2:  [100 %] 100 % (07/23 0554)  Physical Exam:  General: alert and cooperative Lochia: appropriate Uterine Fundus: firm Incision: healing well DVT Evaluation: No evidence of DVT seen on physical exam.   Recent Labs  08/23/13 1130 08/25/13 0544  HGB 11.5* 10.2*  HCT 34.7* 30.7*    Assessment/Plan: Status post Cesarean section. Doing well postoperatively.  Continue current care  plan discharge 08/27/2013 .. Dr. Charlotta Newtonzan covering 08/27/2013 Follow up with me in 2 wks for incision check .  Nicole Hooper J. 08/26/2013, 8:04 AM

## 2013-08-27 LAB — TYPE AND SCREEN
ABO/RH(D): O POS
Antibody Screen: NEGATIVE
Unit division: 0
Unit division: 0

## 2013-08-27 MED ORDER — OXYCODONE-ACETAMINOPHEN 5-325 MG PO TABS: 1.0000 | ORAL_TABLET | ORAL | Status: DC | PRN

## 2013-08-27 MED ORDER — IBUPROFEN 600 MG PO TABS: 600.0000 mg | ORAL_TABLET | Freq: Four times a day (QID) | ORAL | Status: DC

## 2013-08-27 MED ORDER — SENNOSIDES-DOCUSATE SODIUM 8.6-50 MG PO TABS: 2.0000 | ORAL_TABLET | Freq: Every day | ORAL | Status: DC | PRN

## 2013-08-27 NOTE — Discharge Instructions (Signed)

## 2013-08-27 NOTE — Progress Notes (Signed)
Subjective: Postpartum Day 3: Cesarean Delivery Patient reports pain better controlled today.  No acute complaints.  Lochia minimal.  +Flatus, +BM.  No F/C/CP/SOB.  Tolerating general diet.  Voiding and ambulating without difficulty.  Objective: Vital signs in last 24 hours: Temp:  [98.2 F (36.8 C)-98.8 F (37.1 C)] 98.8 F (37.1 C) (07/24 0520) Pulse Rate:  [68-75] 68 (07/24 0520) Resp:  [17-18] 18 (07/24 0520) BP: (100-128)/(57-65) 100/57 mmHg (07/24 0520) SpO2:  [99 %] 99 % (07/23 1726)  Physical Exam:  General: alert and cooperative CV: RRR Lungs: CTAB Abd: soft, non-tender, +BS Uterus: firm, non-tender, below umbilicus Incision: healing well, honeycomb dressing in place C/D/I DVT Evaluation: No evidence of DVT seen on physical exam. 1+ pitting pedal edema bilaterally   Recent Labs  08/25/13 0544  HGB 10.2*  HCT 30.7*    Assessment/Plan:  33y [id number] s/p repeat C-section, POD #3 Doing well postoperatively.  Meeting all postoperative milestones appropriately Baby boy circ performed on 7/23 Plan for discharge home today with 2 week incision check   Myna HidalgoOZAN, Ryla Cauthon, M 08/27/2013, 7:29 AM

## 2013-08-31 NOTE — Discharge Summary (Signed)
Obstetric Discharge Summary Reason for Admission: cesarean section Prenatal Procedures: none Intrapartum Procedures: cesarean: low cervical, transverse Postpartum Procedures: none Complications-Operative and Postpartum: none Date of Admission: 08/24/13 Date of Discharge: 08/27/13 Hemoglobin  Date Value Ref Range Status  08/25/2013 10.2* 12.0 - 15.0 g/dL Final     HCT  Date Value Ref Range Status  08/25/2013 30.7* 36.0 - 46.0 % Final   Hospital Course: Nicole Hooper is a 33 y.o. female G2P0100 at 36 wks and 3 days based on LMP 12/12/2012 confirmed by 18 wk ultrasound with EDD 09/18/2013. She is presenting for repeat cesarean section given h/o classical cesarean section. She has received Makena since 16 wks of EGA.  For information regarding her delivery performed by Dr. Richardson Doppole, please see the operative report.  Her postoperative course was uncomplicated and she was discharged home in stable condition on postoperative day #3.   Physical Exam: General: alert and cooperative  CV: RRR Lungs: CTAB  Abd: soft, non-tender, +BS  Uterus: firm, non-tender, below umbilicus  Incision: healing well, honeycomb dressing in place C/D/I  DVT Evaluation: No evidence of DVT seen on physical exam. 1+ pitting pedal edema bilaterally  Discharge Diagnoses: Preterm pregnancy-delivered  Discharge Information: Date: 08/31/2013 Activity: pelvic rest Diet: routine Medications: Ibuprofen, Colace and Percocet Condition: stable Instructions: refer to practice specific booklet Discharge to: home Follow-up Information   Follow up with Nicole Hooper,Nicole J., Nicole Hooper. Schedule an appointment as soon as possible for a visit in 2 weeks. (incision check )    Specialty:  Obstetrics and Gynecology   Contact information:   [address] [place] [address] [phone]       Newborn Data: Live born female  Birth Weight: 6 lb 3.3 oz (2815 g) APGAR: 9, 9  Home with mother.  Nicole Hooper, Nicole Hooper, Nicole Hooper 08/31/2013, 7:43 PM

## 2013-12-06 ENCOUNTER — Encounter (HOSPITAL_COMMUNITY): Payer: Self-pay | Admitting: Obstetrics and Gynecology

## 2013-12-08 ENCOUNTER — Other Ambulatory Visit: Payer: Self-pay | Admitting: Obstetrics and Gynecology

## 2013-12-08 ENCOUNTER — Other Ambulatory Visit (HOSPITAL_COMMUNITY)
Admission: RE | Admit: 2013-12-08 | Discharge: 2013-12-08 | Disposition: A | Discharge status: Home / Self Care | Payer: BC Managed Care – PPO | Source: Ambulatory Visit | Attending: Obstetrics and Gynecology | Admitting: Obstetrics and Gynecology

## 2013-12-08 DIAGNOSIS — Z01419 Encounter for gynecological examination (general) (routine) without abnormal findings: Secondary | ICD-10-CM | POA: Diagnosis not present

## 2013-12-10 LAB — CYTOLOGY - PAP

## 2014-12-12 ENCOUNTER — Other Ambulatory Visit: Payer: Self-pay | Admitting: Obstetrics and Gynecology

## 2014-12-12 ENCOUNTER — Other Ambulatory Visit (HOSPITAL_COMMUNITY)
Admission: RE | Admit: 2014-12-12 | Discharge: 2014-12-12 | Disposition: A | Discharge status: Home / Self Care | Payer: 59 | Source: Ambulatory Visit | Attending: Obstetrics and Gynecology | Admitting: Obstetrics and Gynecology

## 2014-12-12 DIAGNOSIS — Z01419 Encounter for gynecological examination (general) (routine) without abnormal findings: Secondary | ICD-10-CM | POA: Insufficient documentation

## 2014-12-13 LAB — CYTOLOGY - PAP

## 2015-12-15 ENCOUNTER — Other Ambulatory Visit: Payer: Self-pay | Admitting: Obstetrics and Gynecology

## 2015-12-15 ENCOUNTER — Other Ambulatory Visit (HOSPITAL_COMMUNITY)
Admission: RE | Admit: 2015-12-15 | Discharge: 2015-12-15 | Disposition: A | Discharge status: Home / Self Care | Payer: BLUE CROSS/BLUE SHIELD | Source: Ambulatory Visit | Attending: Obstetrics and Gynecology | Admitting: Obstetrics and Gynecology

## 2015-12-15 DIAGNOSIS — Z01419 Encounter for gynecological examination (general) (routine) without abnormal findings: Secondary | ICD-10-CM | POA: Diagnosis present

## 2015-12-15 DIAGNOSIS — Z1151 Encounter for screening for human papillomavirus (HPV): Secondary | ICD-10-CM | POA: Diagnosis not present

## 2015-12-21 LAB — CYTOLOGY - PAP
DIAGNOSIS: NEGATIVE
HPV: NOT DETECTED

## 2016-04-27 IMAGING — US US FETAL BPP W/O NONSTRESS
1 series · 14 of 16 positions shown · non-contrast
Comparison: none

[Series 1: us fetal bpp w/o nonstress · non-contrast · 16 acquisitions, 14 frames shown]
[im 1/16]
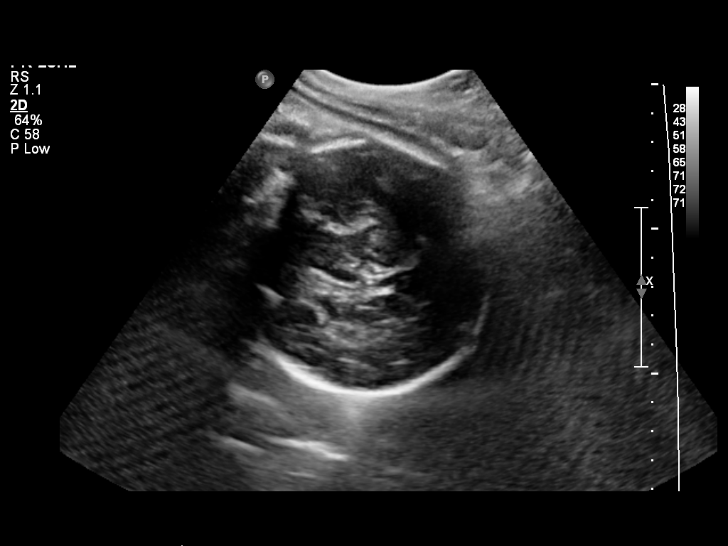
[im 2/16]
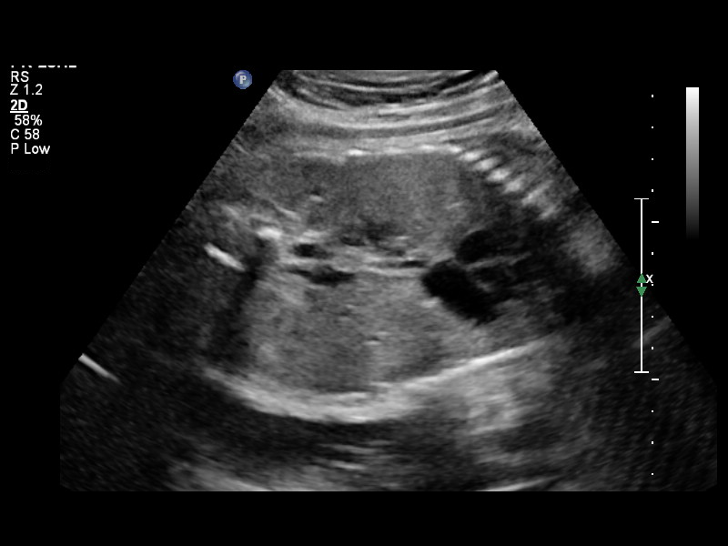
[im 3/16]
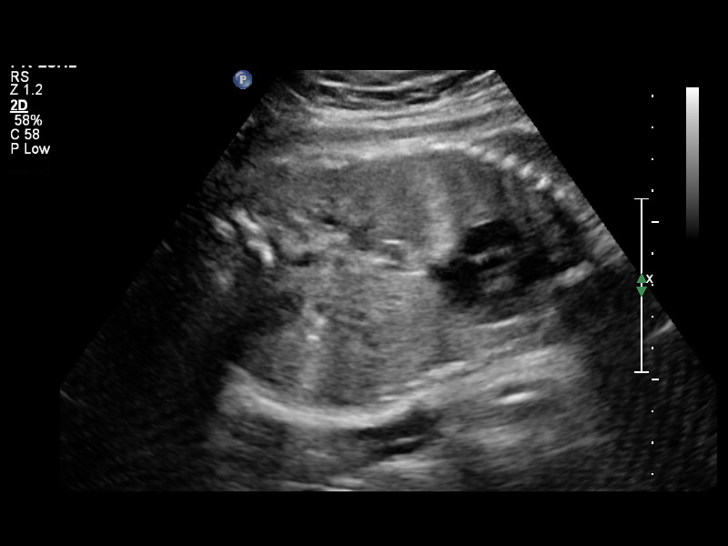
[im 5/16]
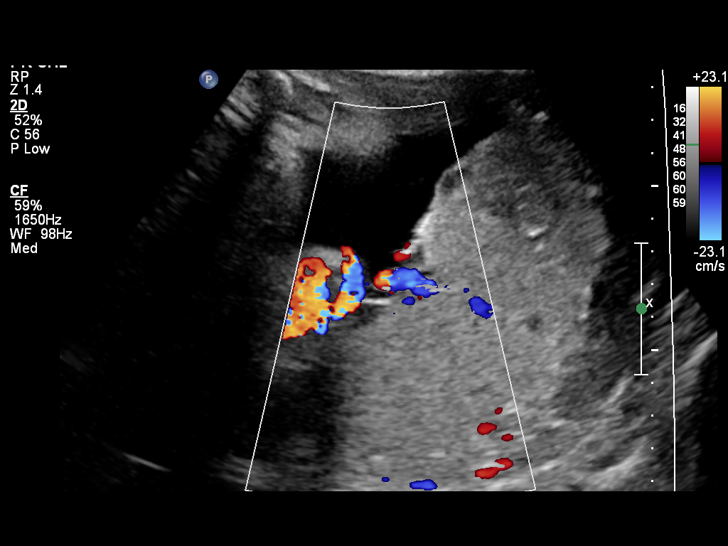
[im 6/16]
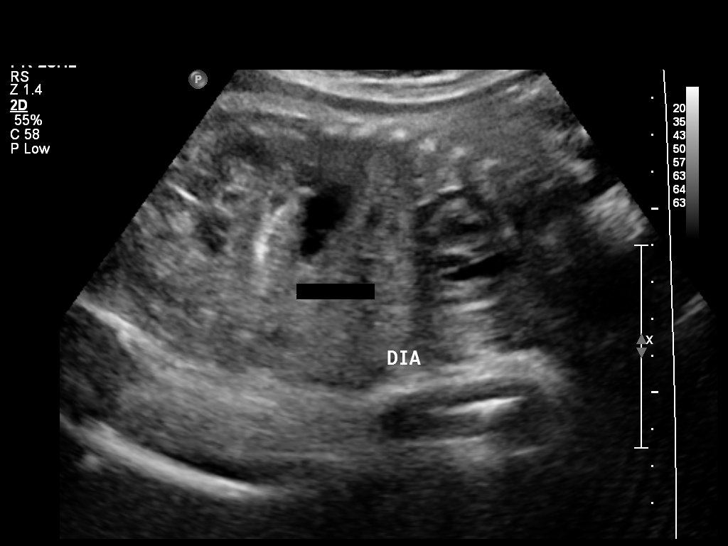
[im 7/16]
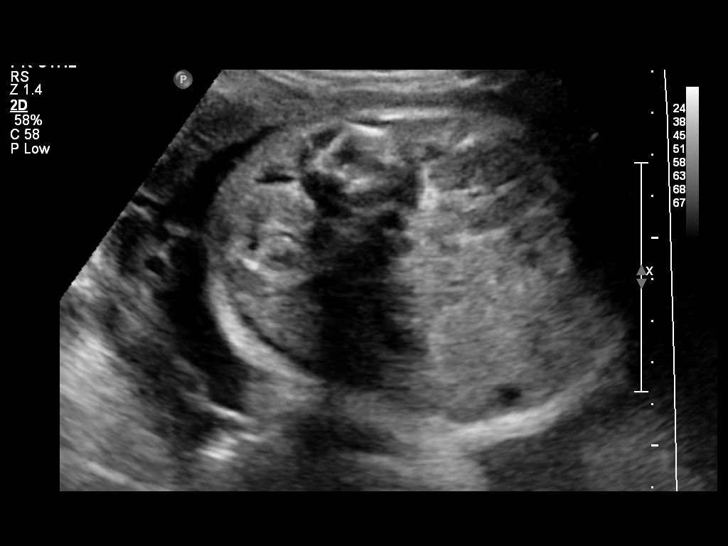
[im 8/16]
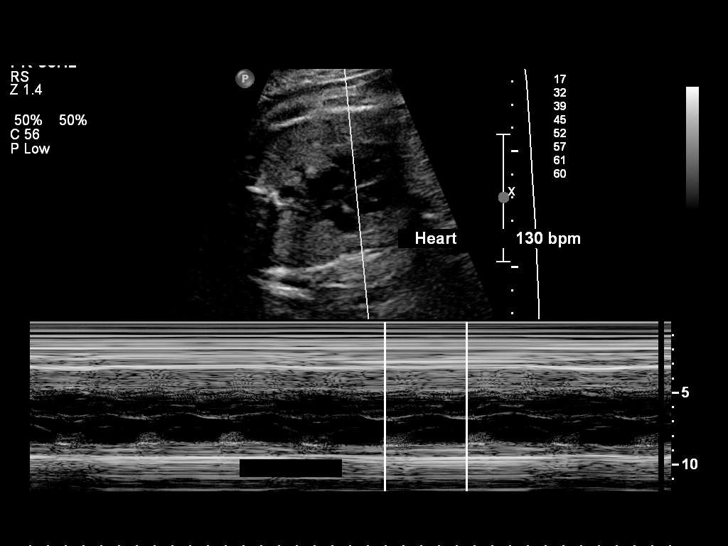
[im 9/16]
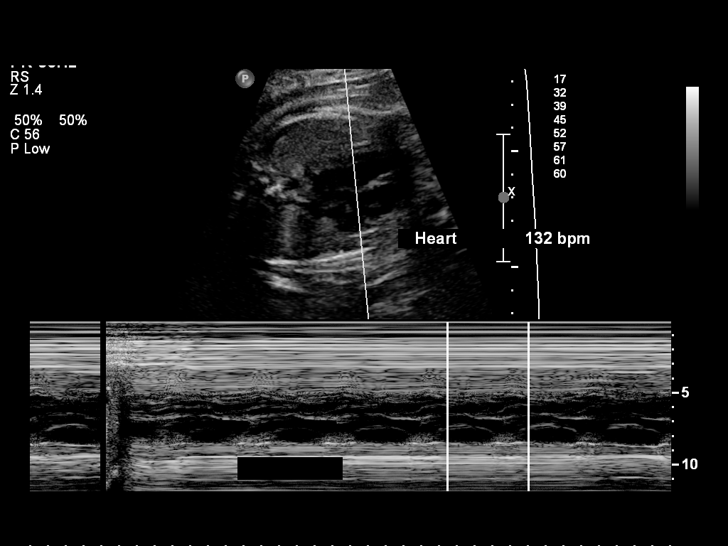
[im 10/16]
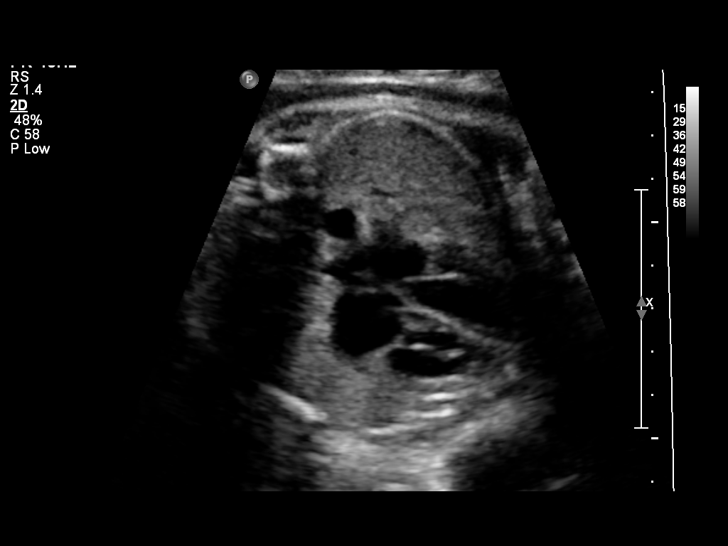
[im 11/16]
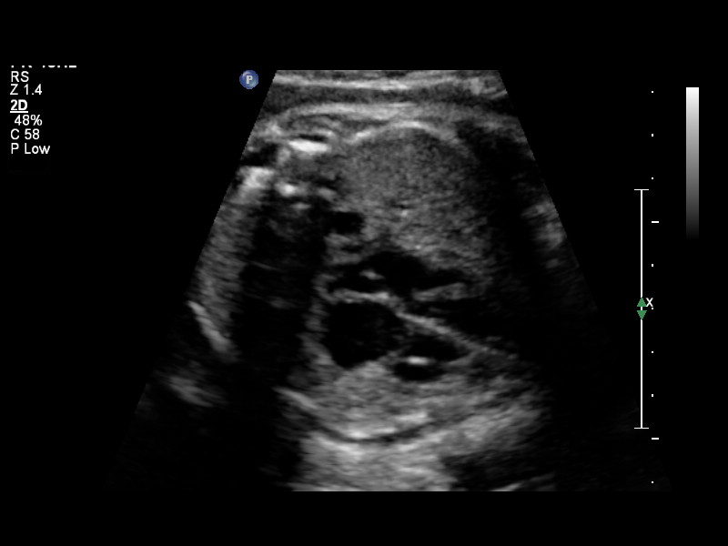
[im 13/16]
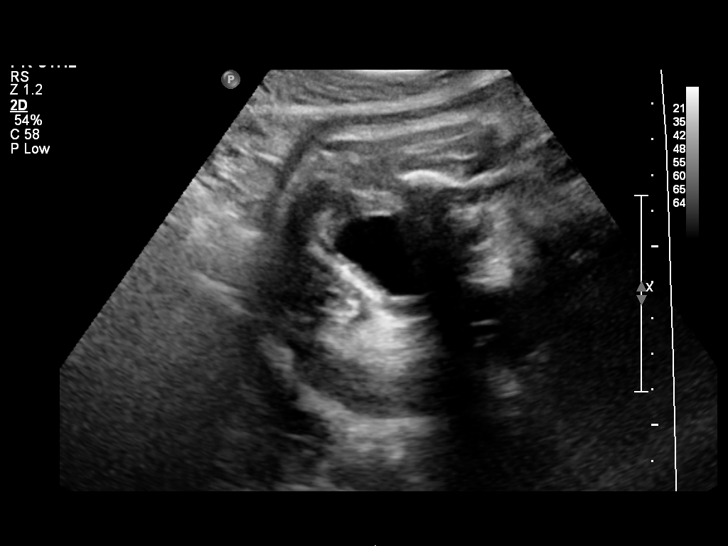
[im 14/16]
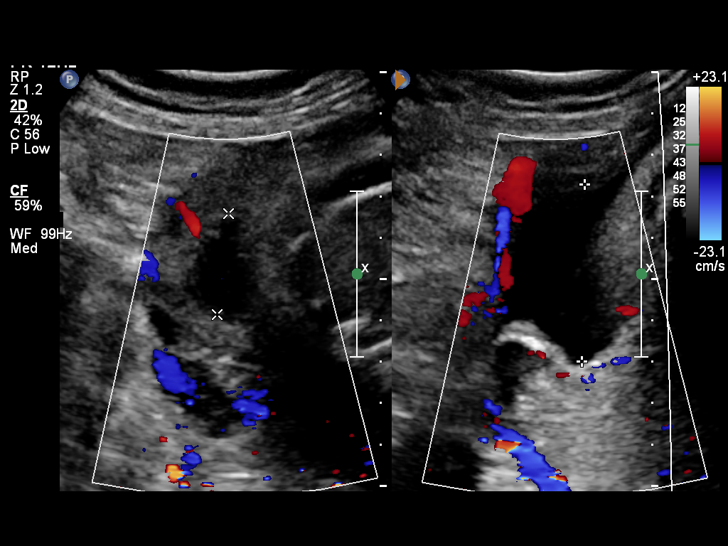
[im 15/16]
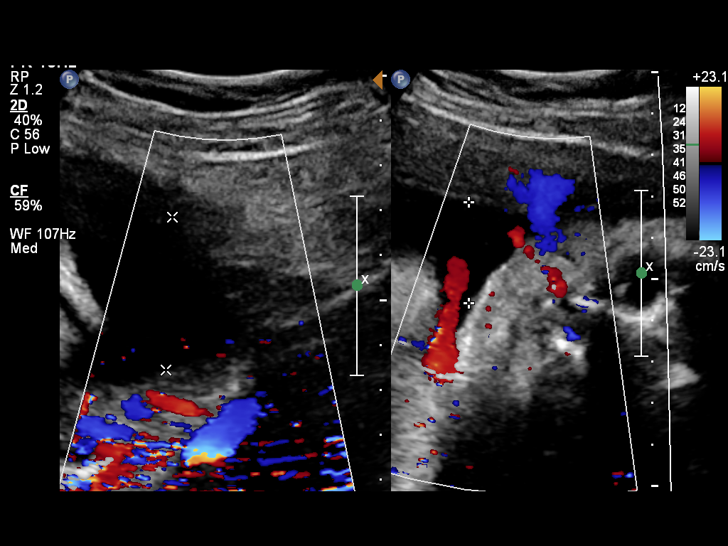
[im 16/16]
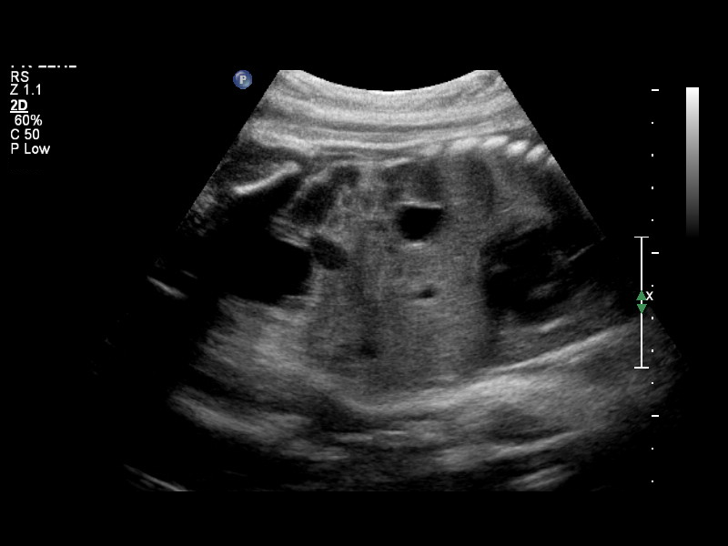

[14 of 16 positions shown; findings below may reference images not displayed]

OBSTETRICS REPORT
                      (Signed Final 08/09/2013 [DATE])

Service(s) Provided

Indications

 Diabetes - Gestational, A2 (medication controlled)
 Previous cesarean section x 1
Fetal Evaluation

 Num Of Fetuses:    1
 Fetal Heart Rate:  130                          bpm
 Cardiac Activity:  Observed
 Presentation:      Cephalic
 Placenta:          Posterior, above cervical
                    os
 P. Cord            Visualized, central
 Insertion:

 Amniotic Fluid
 AFI FV:      Subjectively within normal limits
 AFI Sum:     12.52   cm                     Larg Pckt:    4.26  cm
 RUQ:   2.44    cm   RLQ:    3.39   cm    LUQ:   4.26    cm   LLQ:    2.43   cm
Biophysical Evaluation

 Amniotic F.V:   Within normal limits       F. Tone:        Observed
 F. Movement:    Observed                   Score:          [DATE]
 F. Breathing:   Observed
Cervix Uterus Adnexa

 Cervix:       Not visualized (advanced GA >05wks)
Impression

 Single IUP at 34 [DATE] weeks
 A2 GDM
 BPP [DATE]
 Normal amniotic fluid volume
Recommendations

 Recommend continued antepartum fetal testing (2x weekly
 NSTs with weekly AFIs)
 questions or concerns.

## 2022-11-07 ENCOUNTER — Other Ambulatory Visit: Payer: Self-pay

## 2022-11-07 ENCOUNTER — Encounter: Payer: Self-pay | Admitting: Allergy & Immunology

## 2022-11-07 ENCOUNTER — Ambulatory Visit: Payer: Medicaid Other | Admitting: Allergy & Immunology

## 2022-11-07 VITALS — BP 122/78 | HR 93 | Temp 98.0°F | Resp 18 | Ht 65.75 in | Wt 125.1 lb

## 2022-11-07 DIAGNOSIS — J310 Chronic rhinitis: Secondary | ICD-10-CM

## 2022-11-07 DIAGNOSIS — R21 Rash and other nonspecific skin eruption: Secondary | ICD-10-CM | POA: Diagnosis not present

## 2022-11-07 NOTE — Progress Notes (Signed)
NEW PATIENT  Date of Service/Encounter:  11/07/22  Consult requested by: Deatra James, MD   Assessment:   Rash  Chronic rhinitis  Plan/Recommendations:   1. Rash - We will know more when we do the testing at the next visit. - Ask your sister about the rash and have her send you pictures. - This will provide some clues as to what this might be.  - I am going to work on getting those labs.   2. Chronic rhinitis - We will do environmental allergy testing at the next visit.  - We cannot test for poison ivy since this is a slightly different process.  - We will know more when we do the testing.   3. Return in about 7 weeks (around 12/26/2022). You can have the follow up appointment with Dr. Dellis Anes or a Nurse Practicioner (our Nurse Practitioners are excellent and always have Physician oversight!).    This note in its entirety was forwarded to the Provider who requested this consultation.  Subjective:   Nicole Hooper is a 42 y.o. female presenting today for evaluation of  Chief Complaint  Patient presents with   Allergic Rhinitis     Itchy eyes, faces had large bumps and red/ puffy     Nicole Hooper has a history of the following: Patient Active Problem List   Diagnosis Date Noted   S/P cesarean section 08/24/2013   Status post primary low transverse cesarean section--preterm labor, breech 10/03/2012    History obtained from: chart review and patient.  Discussed the use of AI scribe software for clinical note transcription with the patient, who gave verbal consent to proceed.  Nicole Hooper was referred by Deatra James, MD.     Lanaiya is a 42 y.o. female presenting for an evaluation of an allergic reaction .  The patient presents with a history of a facial rash that began in June and worsened in August. The rash was characterized by redness, puffiness, and itching around the eyes, and sensitivity and itching on the face. The patient also reported the development of  blister-like lesions, some of which were fluid-filled, particularly around the nose. The rash lasted over a month, with itching persisting for three to four weeks. The patient was treated with Prednisone and Claritin, which eventually led to resolution of the rash, but left the patient feeling facial puffiness. This was the first occurrence of such a rash for the patient.  She does have pictures of it.  She is going to email it to the office email account so we can get this into her chart.  The patient suspects the rash may be related to outdoor exposure, as they were outside two days prior to the onset of the rash in August. They deny any food allergies and have no known history of poison ivy exposure. The patient also reports a history of environmental allergies, particularly in the summer.  The patient underwent lab work for lupus due to the butterfly-like rash on their face, but the ANA was negative. They deny any joint pain or fever during the rash episode. The patient also reports a history of itchy reactions to dogs. The patient's nine-year-old son and one-year-old niece have had similar, though not identical, skin flare-ups in the past.     Otherwise, there is no history of other atopic diseases, including asthma, food allergies, drug allergies, stinging insect allergies, or contact dermatitis. There is no significant infectious history. Vaccinations are up to date.  Past Medical History: Patient Active Problem List   Diagnosis Date Noted   S/P cesarean section 08/24/2013   Status post primary low transverse cesarean section--preterm labor, breech 10/03/2012    Medication List:  Allergies as of 11/07/2022   No Known Allergies      Medication List        Accurate as of November 07, 2022 11:19 AM. If you have any questions, ask your nurse or doctor.          ibuprofen 600 MG tablet Commonly known as: ADVIL Take 1 tablet (600 mg total) by mouth every 6 (six) hours.   INTEGRA  PO Take 1 tablet by mouth daily.   loratadine 10 MG tablet Commonly known as: CLARITIN Take 10 mg by mouth daily.   oxyCODONE-acetaminophen 5-325 MG tablet Commonly known as: PERCOCET/ROXICET Take 1 tablet by mouth every 4 (four) hours as needed for severe pain (moderate - severe pain).   senna-docusate 8.6-50 MG tablet Commonly known as: Senokot-S Take 2 tablets by mouth daily as needed for mild constipation.        Birth History: non-contributory  Developmental History: non-contributory  Past Surgical History: Past Surgical History:  Procedure Laterality Date   CESAREAN SECTION N/A 10/01/2012   Procedure: CESAREAN SECTION;  Surgeon: Dorien Chihuahua. Richardson Dopp, MD;  Location: [facility];  Service: Obstetrics;  Laterality: N/A;   CESAREAN SECTION N/A 08/24/2013   Procedure: REPEAT CESAREAN SECTION;  Surgeon: Dorien Chihuahua. Richardson Dopp, MD;  Location: [facility];  Service: Obstetrics;  Laterality: N/A;   fractured foot  2006   left foot surgery   WISDOM TOOTH EXTRACTION       Family History: Family History  Problem Relation Age of Onset   Allergic rhinitis Father      Social History: Haeven lives at home with her family.  They live in a house.  There is laminated hardwood throughout the home.  They have gas heating and central cooling.  There are cats outside of the home.  There are dust mite covers on the nose, but not the bed.  There is no fume, chemical, or dust exposure.  There is a HEPA filter in the home.  They do not live near interstate or industrial area.   Review of systems otherwise negative other than that mentioned in the HPI.    Objective:   Blood pressure 122/78, pulse 93, temperature 98 F (36.7 C), resp. rate 18, height 5' 5.75" (1.67 m), weight 125 lb 1.6 oz (56.7 kg), SpO2 100%, unknown if currently breastfeeding. Body mass index is 20.35 kg/m.     Physical Exam Vitals reviewed.  Constitutional:      Appearance: She is well-developed and well-groomed.     Comments:  Well-appearing.  HENT:     Head: Normocephalic and atraumatic.     Right Ear: Tympanic membrane, ear canal and external ear normal. No drainage, swelling or tenderness. Tympanic membrane is not injected, scarred, erythematous, retracted or bulging.     Left Ear: Tympanic membrane, ear canal and external ear normal. No drainage, swelling or tenderness. Tympanic membrane is not injected, scarred, erythematous, retracted or bulging.     Nose: No nasal deformity, septal deviation, mucosal edema or rhinorrhea.     Right Turbinates: Enlarged, swollen and pale.     Left Turbinates: Enlarged, swollen and pale.     Right Sinus: No maxillary sinus tenderness or frontal sinus tenderness.     Left Sinus: No maxillary sinus tenderness or frontal sinus tenderness.  Mouth/Throat:     Mouth: Mucous membranes are not pale and not dry.     Pharynx: Uvula midline.  Eyes:     General:        Right eye: No discharge.        Left eye: No discharge.     Conjunctiva/sclera: Conjunctivae normal.     Right eye: Right conjunctiva is not injected. No chemosis.    Left eye: Left conjunctiva is not injected. No chemosis.    Pupils: Pupils are equal, round, and reactive to light.  Cardiovascular:     Rate and Rhythm: Normal rate and regular rhythm.     Heart sounds: Normal heart sounds.  Pulmonary:     Effort: Pulmonary effort is normal. No tachypnea, accessory muscle usage or respiratory distress.     Breath sounds: Normal breath sounds. No wheezing, rhonchi or rales.  Chest:     Chest wall: No tenderness.  Abdominal:     Tenderness: There is no abdominal tenderness. There is no guarding or rebound.  Lymphadenopathy:     Head:     Right side of head: No submandibular, tonsillar or occipital adenopathy.     Left side of head: No submandibular, tonsillar or occipital adenopathy.     Cervical: No cervical adenopathy.  Skin:    General: Skin is warm.     Capillary Refill: Capillary refill takes less than 2  seconds.     Coloration: Skin is not pale.     Findings: No abrasion, erythema, petechiae or rash. Rash is not papular, urticarial or vesicular.     Comments: No eczematous or urticarial lesions noted.  Neurological:     Mental Status: She is alert.  Psychiatric:        Behavior: Behavior is cooperative.      Diagnostic studies: deferred due to insurance stipulations that refuse to pay for testing at initial visits, making it more difficult for patients to get the care they need         Malachi Bonds, MD [facility]

## 2022-11-07 NOTE — Patient Instructions (Addendum)
1. Rash - We will know more when we do the testing at the next visit. - Ask your sister about the rash and have her send you pictures. - This will provide some clues as to what this might be.  - I am going to work on getting those labs.   2. Chronic rhinitis - We will do environmental allergy testing at the next visit.  - We cannot test for poison ivy since this is a slightly different process.  - We will know more when we do the testing.   3. Return in about 7 weeks (around 12/26/2022). You can have the follow up appointment with Dr. Dellis Anes or a Nurse Practicioner (our Nurse Practitioners are excellent and always have Physician oversight!).    Please inform us of any Emergency Department visits, hospitalizations, or changes in symptoms. Call us before going to the ED for breathing or allergy symptoms since we might be able to fit you in for a sick visit. Feel free to contact us anytime with any questions, problems, or concerns.  It was a pleasure to meet you today!  Websites that have reliable patient information: 1. American Academy of Asthma, Allergy, and Immunology: www.aaaai.org 2. Food Allergy Research and Education (FARE): foodallergy.org 3. Mothers of Asthmatics: http://www.asthmacommunitynetwork.org 4. American College of Allergy, Asthma, and Immunology: www.acaai.org   COVID-19 Vaccine Information can be found at: [email or web address].nl For questions related to vaccine distribution or appointments, please email [email or web address] or call [phone].     "Like" [place] on Facebook and Instagram for our latest updates!      A healthy democracy works best when [employer] participate! Make sure you are registered to vote! If you have moved or changed any of your contact information, you will need to get this updated before voting! Scan the QR codes below to learn more!

## 2022-11-14 ENCOUNTER — Encounter: Payer: Self-pay | Admitting: Allergy & Immunology

## 2022-11-14 ENCOUNTER — Ambulatory Visit (INDEPENDENT_AMBULATORY_CARE_PROVIDER_SITE_OTHER): Payer: Medicaid Other | Admitting: Allergy & Immunology

## 2022-11-14 VITALS — BP 140/82 | HR 87 | Temp 98.2°F | Resp 20

## 2022-11-14 DIAGNOSIS — J3089 Other allergic rhinitis: Secondary | ICD-10-CM | POA: Diagnosis not present

## 2022-11-14 DIAGNOSIS — R21 Rash and other nonspecific skin eruption: Secondary | ICD-10-CM

## 2022-11-14 MED ORDER — PIMECROLIMUS 1 % EX CREA: TOPICAL_CREAM | Freq: Two times a day (BID) | CUTANEOUS | 1 refills | Status: AC

## 2022-11-14 NOTE — Patient Instructions (Addendum)
1. Rash - Testing to the most common foods was negative. - Add on Elidel twice daily as needed for flares in the future. - We are also happy to see you the next time that this happens.  2. Chronic rhinitis - Testing today showed: indoor molds and dust mites - Copy of test results provided.  - Avoidance measures provided. - Continue with: Claritin (loratadine) 10mg  tablet once daily - You can use an extra dose of the antihistamine, if needed, for breakthrough symptoms.  - Consider nasal saline rinses 1-2 times daily to remove allergens from the nasal cavities as well as help with mucous clearance (this is especially helpful to do before the nasal sprays are given) - Consider allergy shots as a means of long-term control. - Allergy shots "re-train" and "reset" the immune system to ignore environmental allergens and decrease the resulting immune response to those allergens (sneezing, itchy watery eyes, runny nose, nasal congestion, etc).    - Allergy shots improve symptoms in 75-85% of patients.  - We can discuss more at the next appointment if the medications are not working for you. - I am not sure that shots are needed since your rhinitis symptoms are not too severe, however.   3. Return in about 1 year (around 11/14/2023).   Please inform us of any Emergency Department visits, hospitalizations, or changes in symptoms. Call us before going to the ED for breathing or allergy symptoms since we might be able to fit you in for a sick visit. Feel free to contact us anytime with any questions, problems, or concerns.  It was a pleasure to see you again today!  Websites that have reliable patient information: 1. American Academy of Asthma, Allergy, and Immunology: www.aaaai.org 2. Food Allergy Research and Education (FARE): foodallergy.org 3. Mothers of Asthmatics: http://www.asthmacommunitynetwork.org 4. American College of Allergy, Asthma, and Immunology: www.acaai.org   COVID-19 Vaccine  Information can be found at: [email or web address] For questions related to vaccine distribution or appointments, please email [email or web address] or call [phone].     "Like" [place] on Facebook and Instagram for our latest updates!      A healthy democracy works best when [employer] participate! Make sure you are registered to vote! If you have moved or changed any of your contact information, you will need to get this updated before voting! Scan the QR codes below to learn more!       Airborne Adult Perc - 11/14/22 1603     Time Antigen Placed 1603    Allergen Manufacturer Waynette Buttery    Location Back    Number of Test 55    Panel 1 Select    1. Control-Buffer 50% Glycerol Negative    2. Control-Histamine 2+    3. Bahia Negative    4. French Southern Territories Negative    5. Johnson Negative    6. Kentucky Blue Negative    7. Meadow Fescue Negative    8. Perennial Rye Negative    9. Timothy Negative    10. Ragweed Mix Negative    11. Cocklebur Negative    12. Plantain,  English Negative    13. Baccharis Negative    14. Dog Fennel Negative    15. Russian Thistle Negative    16. Lamb's Quarters Negative    17. Sheep Sorrell Negative    18. Rough Pigweed Negative    19. Marsh Elder, Rough Negative    20. Mugwort, Common Negative    21. Box, Elder Negative    22. Cyd Silence,  red Negative    23. Sweet Gum Negative    24. Pecan Pollen Negative    25. Pine Mix Negative    26. Walnut, Black Pollen Negative    27. Red Mulberry Negative    28. Ash Mix Negative    29. Birch Mix Negative    30. Beech American Negative    31. Cottonwood, Guinea-Bissau Negative    32. Hickory, White Negative    33. Maple Mix Negative    34. Oak, [place]iss[place] Mix Negative    35. Sycamore Eastern Negative    36. Alternaria Alternata Negative    37. Cladosporium Herbarum Negative    38. Aspergillus Mix Negative    39. Penicillium Mix Negative    40. Bipolaris Sorokiniana  (Helminthosporium) Negative    41. Drechslera Spicifera (Curvularia) Negative    42. Mucor Plumbeus Negative    43. Fusarium Moniliforme Negative    44. Aureobasidium Pullulans (pullulara) Negative    45. Rhizopus Oryzae Negative    46. Botrytis Cinera Negative    47. Epicoccum Nigrum Negative    48. Phoma Betae Negative    49. Dust Mite Mix Negative    50. Cat Hair 10,000 BAU/ml Negative    51.  Dog Epithelia Negative    52. Mixed Feathers Negative    53. Horse Epithelia Negative    54. Cockroach, German Negative    55. Tobacco Leaf Negative             13 Food Perc - 11/14/22 1603       Test Information   Time Antigen Placed 1603    Allergen Manufacturer Waynette Buttery    Location Back    Number of allergen test 13    Food Select      Food   1. Peanut Negative    2. Soybean Negative    3. Wheat Negative    4. Sesame Negative    5. Milk, Cow Negative    6. Casein Negative    7. Egg White, Chicken Negative    8. Shellfish Mix Negative    9. Fish Mix Negative    10. Cashew Negative    11. Walnut Food Negative    12. Almond Negative    13. Hazelnut Negative             Intradermal - 11/14/22 1632     Time Antigen Placed 1645    Allergen Manufacturer Waynette Buttery    Location Arm    Number of Test 16    Control Negative    Bahia Negative    French Southern Territories Negative    Johnson Negative    7 Grass Negative    Ragweed Mix Negative    Weed Mix Negative    Tree Mix Negative    Mold 1 Negative    Mold 2 Negative    Mold 3 Negative    Mold 4 3+    Mite Mix 3+    Cat Negative    Dog Negative    Cockroach Negative             Control of Dust Mite Allergen    Dust mites play a major role in allergic asthma and rhinitis.  They occur in environments with high humidity wherever human skin is found.  Dust mites absorb humidity from the atmosphere (ie, they do not drink) and feed on organic matter (including shed human and animal skin).  Dust mites are a microscopic type of  insect that you cannot  see with the naked eye.  High levels of dust mites have been detected from mattresses, pillows, carpets, upholstered furniture, bed covers, clothes, soft toys and any woven material.  The principal allergen of the dust mite is found in its feces.  A gram of dust may contain 1,000 mites and 250,000 fecal particles.  Mite antigen is easily measured in the air during house cleaning activities.  Dust mites do not bite and do not cause harm to humans, other than by triggering allergies/asthma.    Ways to decrease your exposure to dust mites in your home:  Encase mattresses, box springs and pillows with a mite-impermeable barrier or cover   Wash sheets, blankets and drapes weekly in hot water (130 F) with detergent and dry them in a dryer on the hot setting.  Have the room cleaned frequently with a vacuum cleaner and a damp dust-mop.  For carpeting or rugs, vacuuming with a vacuum cleaner equipped with a high-efficiency particulate air (HEPA) filter.  The dust mite allergic individual should not be in a room which is being cleaned and should wait 1 hour after cleaning before going into the room. Do not sleep on upholstered furniture (eg, couches).   If possible removing carpeting, upholstered furniture and drapery from the home is ideal.  Horizontal blinds should be eliminated in the rooms where the person spends the most time (bedroom, study, television room).  Washable vinyl, roller-type shades are optimal. Remove all non-washable stuffed toys from the bedroom.  Wash stuffed toys weekly like sheets and blankets above.   Reduce indoor humidity to less than 50%.  Inexpensive humidity monitors can be purchased at most hardware stores.  Do not use a humidifier as can make the problem worse and are not recommended.  Control of Mold Allergen   Mold and fungi can grow on a variety of surfaces provided certain temperature and moisture conditions exist.  Outdoor molds grow on plants, decaying  vegetation and soil.  The major outdoor mold, Alternaria and Cladosporium, are found in very high numbers during hot and dry conditions.  Generally, a late Summer - Fall peak is seen for common outdoor fungal spores.  Rain will temporarily lower outdoor mold spore count, but counts rise rapidly when the rainy period ends.  The most important indoor molds are Aspergillus and Penicillium.  Dark, humid and poorly ventilated basements are ideal sites for mold growth.  The next most common sites of mold growth are the bathroom and the kitchen.   Indoor (Perennial) Mold Control   Positive indoor molds via skin testing: Fusarium, Aureobasidium (Pullulara), and Rhizopus  Maintain humidity below 50%. Clean washable surfaces with 5% bleach solution. Remove sources e.g. contaminated carpets.

## 2022-11-14 NOTE — Progress Notes (Signed)
FOLLOW UP  Date of Service/Encounter:  11/14/22   Assessment:   Rash - with negative testing to the most common foods   Perennial allergic rhinitis (indoor molds and dust mites)  Plan/Recommendations:    1. Rash - Testing to the most common foods was negative. - Add on Elidel twice daily as needed for flares in the future. - We are also happy to see you the next time that this happens.  2. Chronic rhinitis - Testing today showed: indoor molds and dust mites - Copy of test results provided.  - Avoidance measures provided. - Continue with: Claritin (loratadine) 10mg  tablet once daily - You can use an extra dose of the antihistamine, if needed, for breakthrough symptoms.  - Consider nasal saline rinses 1-2 times daily to remove allergens from the nasal cavities as well as help with mucous clearance (this is especially helpful to do before the nasal sprays are given) - Consider allergy shots as a means of long-term control. - Allergy shots "re-train" and "reset" the immune system to ignore environmental allergens and decrease the resulting immune response to those allergens (sneezing, itchy watery eyes, runny nose, nasal congestion, etc).    - Allergy shots improve symptoms in 75-85% of patients.  - We can discuss more at the next appointment if the medications are not working for you. - I am not sure that shots are needed since your rhinitis symptoms are not too severe, however.   3. Return in about 1 year (around 11/14/2023).   Subjective:   Nicole Hooper is a 42 y.o. female presenting today for follow up of No chief complaint on file.   Nicole Hooper has a history of the following: Patient Active Problem List   Diagnosis Date Noted   S/P cesarean section 08/24/2013   Status post primary low transverse cesarean section--preterm labor, breech 10/03/2012    History obtained from: chart review and patient.  Discussed the use of AI scribe software for clinical note  transcription with the patient and/or guardian, who gave verbal consent to proceed.  Nicole Hooper is a 42 y.o. female presenting for skin testing. She was last seen on October 3. We could not do testing because her insurance company does not cover testing on the same day as a New Patient visit. She has been off of all antihistamines 3 days in anticipation of the testing.   In the interim, she has done well.  Pictures of her rash (emailed to the office account after the visit)    Otherwise, there have been no changes to her past medical history, surgical history, family history, or social history.    Review of systems otherwise negative other than that mentioned in the HPI.    Objective:   Blood pressure (!) 140/82, pulse 87, temperature 98.2 F (36.8 C), temperature source Temporal, resp. rate 20, SpO2 100%, unknown if currently breastfeeding. There is no height or weight on file to calculate BMI.    Physical exam deferred since this was a skin testing appointment only.   Diagnostic studies:   Allergy Studies:     Airborne Adult Perc - 11/14/22 1603     Time Antigen Placed 1603    Allergen Manufacturer Waynette Buttery    Location Back    Number of Test 55    Panel 1 Select    1. Control-Buffer 50% Glycerol Negative    2. Control-Histamine 2+    3. Bahia Negative    4. French Southern Territories Negative  5. Johnson Negative    6. Kentucky Blue Negative    7. Meadow Fescue Negative    8. Perennial Rye Negative    9. Timothy Negative    10. Ragweed Mix Negative    11. Cocklebur Negative    12. Plantain,  English Negative    13. Baccharis Negative    14. Dog Fennel Negative    15. Russian Thistle Negative    16. Lamb's Quarters Negative    17. Sheep Sorrell Negative    18. Rough Pigweed Negative    19. Marsh Elder, Rough Negative    20. Mugwort, Common Negative    21. Box, Elder Negative    22. Cedar, red Negative    23. Sweet Gum Negative    24. Pecan Pollen Negative    25. Pine Mix Negative     26. Walnut, Black Pollen Negative    27. Red Mulberry Negative    28. Ash Mix Negative    29. Birch Mix Negative    30. Beech American Negative    31. Cottonwood, Guinea-Bissau Negative    32. Hickory, White Negative    33. Maple Mix Negative    34. Oak, Guinea-[place]iss[place] Mix Negative    35. Sycamore Eastern Negative    36. Alternaria Alternata Negative    37. Cladosporium Herbarum Negative    38. Aspergillus Mix Negative    39. Penicillium Mix Negative    40. Bipolaris Sorokiniana (Helminthosporium) Negative    41. Drechslera Spicifera (Curvularia) Negative    42. Mucor Plumbeus Negative    43. Fusarium Moniliforme Negative    44. Aureobasidium Pullulans (pullulara) Negative    45. Rhizopus Oryzae Negative    46. Botrytis Cinera Negative    47. Epicoccum Nigrum Negative    48. Phoma Betae Negative    49. Dust Mite Mix Negative    50. Cat Hair 10,000 BAU/ml Negative    51.  Dog Epithelia Negative    52. Mixed Feathers Negative    53. Horse Epithelia Negative    54. Cockroach, German Negative    55. Tobacco Leaf Negative             13 Food Perc - 11/14/22 1603       Test Information   Time Antigen Placed 1603    Allergen Manufacturer Waynette Buttery    Location Back    Number of allergen test 13    Food Select      Food   1. Peanut Negative    2. Soybean Negative    3. Wheat Negative    4. Sesame Negative    5. Milk, Cow Negative    6. Casein Negative    7. Egg White, Chicken Negative    8. Shellfish Mix Negative    9. Fish Mix Negative    10. Cashew Negative    11. Walnut Food Negative    12. Almond Negative    13. Hazelnut Negative             Intradermal - 11/14/22 1632     Time Antigen Placed 1645    Allergen Manufacturer Waynette Buttery    Location Arm    Number of Test 16    Control Negative    Bahia Negative    French Southern Territories Negative    Johnson Negative    7 Grass Negative    Ragweed Mix Negative    Weed Mix Negative    Tree Mix Negative    Mold 1 Negative  Mold 2  Negative    Mold 3 Negative    Mold 4 3+    Mite Mix 3+    Cat Negative    Dog Negative    Cockroach Negative             Allergy testing results were read and interpreted by myself, documented by clinical staff.      Malachi Bonds, MD  [facility]

## 2022-12-12 ENCOUNTER — Telehealth: Payer: Self-pay

## 2022-12-12 NOTE — Telephone Encounter (Signed)
*  Asthma/Allergy  Prior Authorization form/request asks a question that requires your assistance. Please see the question below and advise accordingly.    Has the patient tried and failed on at least ONE prescription topical corticosteroid?*  CMM Key: [id number]

## 2022-12-12 NOTE — Telephone Encounter (Signed)
Appears pt has not tried anything else

## 2022-12-12 NOTE — Telephone Encounter (Signed)
Insurance will not cover unless pt try's and fails a different topical corticosteroid advise to change in therapy pleased

## 2022-12-12 NOTE — Telephone Encounter (Signed)
Insurance will not cover if patient has not tried a topical prescription corticosteroid.

## 2022-12-13 MED ORDER — HYDROCORTISONE 2.5 % EX CREA: TOPICAL_CREAM | Freq: Two times a day (BID) | CUTANEOUS | 2 refills | Status: AC | PRN

## 2022-12-13 NOTE — Telephone Encounter (Signed)
Fine. I sent in hydrocortisone 2.5% cream twice daily as needed.  Malachi Bonds, MD [facility]

## 2022-12-13 NOTE — Addendum Note (Signed)
Addended by: Alfonse Spruce on: 12/13/2022 04:32 PM   Modules accepted: Orders

## 2022-12-31 ENCOUNTER — Encounter: Payer: Self-pay | Admitting: Allergy & Immunology

## 2022-12-31 ENCOUNTER — Other Ambulatory Visit: Payer: Self-pay

## 2022-12-31 ENCOUNTER — Ambulatory Visit (INDEPENDENT_AMBULATORY_CARE_PROVIDER_SITE_OTHER): Payer: Medicaid Other | Admitting: Allergy & Immunology

## 2022-12-31 VITALS — BP 120/68 | HR 95 | Temp 97.6°F | Resp 18 | Ht 65.35 in | Wt 123.0 lb

## 2022-12-31 DIAGNOSIS — J3089 Other allergic rhinitis: Secondary | ICD-10-CM

## 2022-12-31 DIAGNOSIS — R21 Rash and other nonspecific skin eruption: Secondary | ICD-10-CM | POA: Diagnosis not present

## 2022-12-31 NOTE — Patient Instructions (Signed)
1. Rash - Continue with the Elidel twice daily as needed for flares in the future (this is safe to use on the face).  - Definitely call us when this happens again.   2. Chronic rhinitis - Previous testing showed: indoor molds and dust mites - Continue with: Claritin (loratadine) 10mg  tablet once daily - You can use an extra dose of the antihistamine, if needed, for breakthrough symptoms.  - Consider nasal saline rinses 1-2 times daily to remove allergens from the nasal cavities as well as help with mucous clearance (this is especially helpful to do before the nasal sprays are given) - Consider allergy shots as a means of long-term control. - Allergy shots "re-train" and "reset" the immune system to ignore environmental allergens and decrease the resulting immune response to those allergens (sneezing, itchy watery eyes, runny nose, nasal congestion, etc).    - Allergy shots improve symptoms in 75-85% of patients.  - We can discuss more at the next appointment if the medications are not working for you. - I am not sure that shots are needed since your rhinitis symptoms are not too severe, however.   3. Return in about 6 months (around 06/30/2023).    Please inform us of any Emergency Department visits, hospitalizations, or changes in symptoms. Call us before going to the ED for breathing or allergy symptoms since we might be able to fit you in for a sick visit. Feel free to contact us anytime with any questions, problems, or concerns.  It was a pleasure to see you again today!  Websites that have reliable patient information: 1. American Academy of Asthma, Allergy, and Immunology: www.aaaai.org 2. Food Allergy Research and Education (FARE): foodallergy.org 3. Mothers of Asthmatics: http://www.asthmacommunitynetwork.org 4. American College of Allergy, Asthma, and Immunology: www.acaai.org      "Like" [place] on Facebook and Instagram for our latest updates!      A healthy democracy works  best when [employer] participate! Make sure you are registered to vote! If you have moved or changed any of your contact information, you will need to get this updated before voting! Scan the QR codes below to learn more!

## 2022-12-31 NOTE — Progress Notes (Unsigned)
   FOLLOW UP  Date of Service/Encounter:  12/31/22   Assessment:   Rash - with negative testing to the most common foods   Perennial allergic rhinitis (indoor molds and dust mites)  Plan/Recommendations:   Assessment and Plan              There are no Patient Instructions on file for this visit.   Subjective:   Nicole Hooper is a 42 y.o. female presenting today for follow up of  Chief Complaint  Patient presents with  . Rash    No issues - got medications refilled by pcp    Nicole Hooper has a history of the following: Patient Active Problem List   Diagnosis Date Noted  . S/P cesarean section 08/24/2013  . Status post primary low transverse cesarean section--preterm labor, breech 10/03/2012    History obtained from: chart review and patient.  Discussed the use of AI scribe software for clinical note transcription with the patient and/or guardian, who gave verbal consent to proceed.  Nicole Hooper is a 42 y.o. female presenting for a follow up visit.  She was last seen in October 2024.  At that time, testing the most common foods was completely negative. Testing was positive to indoor milds and dust mites.   Discussed the use of AI scribe software for clinical note transcription with the patient, who gave verbal consent to proceed.  History of Present Illness            Was a  {Blank single:19197::"Allergic Rhinitis Symptom History: ***"," "}  {Blank single:19197::"Food Allergy Symptom History: ***"," "}  {Blank single:19197::"Skin Symptom History: ***"," "}  {Blank single:19197::"GERD Symptom History: ***"," "}  Otherwise, there have been no changes to her past medical history, surgical history, family history, or social history.    Review of systems otherwise negative other than that mentioned in the HPI.    Objective:   Blood pressure 120/68, pulse 95, temperature 97.6 F (36.4 C), resp. rate 18, height 5' 5.35" (1.66 m), weight 123 lb (55.8 kg), SpO2  99%, unknown if currently breastfeeding. Body mass index is 20.25 kg/m.    Physical Exam   Diagnostic studies: {Blank single:19197::"none","deferred due to recent antihistamine use","deferred due to insurance stipulations that require a separate visit for testing","labs sent instead"," "}  Spirometry: {Blank single:19197::"results normal (FEV1: ***%, FVC: ***%, FEV1/FVC: ***%)","results abnormal (FEV1: ***%, FVC: ***%, FEV1/FVC: ***%)"}.    {Blank single:19197::"Spirometry consistent with mild obstructive disease","Spirometry consistent with moderate obstructive disease","Spirometry consistent with severe obstructive disease","Spirometry consistent with possible restrictive disease","Spirometry consistent with mixed obstructive and restrictive disease","Spirometry uninterpretable due to technique","Spirometry consistent with normal pattern"}. {Blank single:19197::"Albuterol/Atrovent nebulizer","Xopenex/Atrovent nebulizer","Albuterol nebulizer","Albuterol four puffs via MDI","Xopenex four puffs via MDI"} treatment given in clinic with {Blank single:19197::"significant improvement in FEV1 per ATS criteria","significant improvement in FVC per ATS criteria","significant improvement in FEV1 and FVC per ATS criteria","improvement in FEV1, but not significant per ATS criteria","improvement in FVC, but not significant per ATS criteria","improvement in FEV1 and FVC, but not significant per ATS criteria","no improvement"}.  Allergy Studies: {Blank single:19197::"none","deferred due to recent antihistamine use","deferred due to insurance stipulations that require a separate visit for testing","labs sent instead"," "}    {Blank single:19197::"Allergy testing results were read and interpreted by myself, documented by clinical staff."," "}      Malachi Bonds, MD  [facility]

## 2023-01-01 ENCOUNTER — Encounter: Payer: Self-pay | Admitting: Allergy & Immunology

## 2023-07-01 ENCOUNTER — Encounter: Payer: Self-pay | Admitting: Allergy & Immunology

## 2023-07-01 ENCOUNTER — Other Ambulatory Visit: Payer: Self-pay

## 2023-07-01 ENCOUNTER — Ambulatory Visit (INDEPENDENT_AMBULATORY_CARE_PROVIDER_SITE_OTHER): Payer: Medicaid Other | Admitting: Allergy & Immunology

## 2023-07-01 VITALS — BP 130/80 | HR 83 | Temp 97.2°F | Resp 18 | Ht 65.75 in | Wt 125.1 lb

## 2023-07-01 DIAGNOSIS — R21 Rash and other nonspecific skin eruption: Secondary | ICD-10-CM

## 2023-07-01 DIAGNOSIS — J3089 Other allergic rhinitis: Secondary | ICD-10-CM | POA: Diagnosis not present

## 2023-07-01 MED ORDER — TACROLIMUS 0.1 % EX OINT: TOPICAL_OINTMENT | Freq: Two times a day (BID) | CUTANEOUS | 5 refills | Status: AC

## 2023-07-01 NOTE — Progress Notes (Unsigned)
   FOLLOW UP  Date of Service/Encounter:  07/01/23   Assessment:   Rash - with negative testing to the most common foods   Perennial allergic rhinitis (indoor molds and dust mites) - doing well with PRN medications    Plan/Recommendations:   There are no Patient Instructions on file for this visit.   Subjective:   Nicole Hooper is a 43 y.o. female presenting today for follow up of  Chief Complaint  Patient presents with   Establish Care   Allergies   Eczema    Nicole Hooper has a history of the following: Patient Active Problem List   Diagnosis Date Noted   S/P cesarean section 08/24/2013   Status post primary low transverse cesarean section--preterm labor, breech 10/03/2012    History obtained from: chart review and patient.  Discussed the use of AI scribe software for clinical note transcription with the patient and/or guardian, who gave verbal consent to proceed.  Nicole Hooper is a 43 y.o. female presenting for a follow up visit.  She was last seen in November 2024.  At that time, we continue with Elidel  twice daily as needed.  For her rhinitis, we continue with Claritin.  We did talk about allergy  shots for long-term control.  Since last visit, she has done very well.   Asthma/Respiratory Symptom History: ***  Allergic Rhinitis Symptom History: ***  Food Allergy  Symptom History: ***  Skin Symptom History: ***  GERD Symptom History: ***  Infection Symptom History: ***  Otherwise, there have been no changes to her past medical history, surgical history, family history, or social history.    Review of systems otherwise negative other than that mentioned in the HPI.    Objective:   Blood pressure 130/80, pulse 83, temperature (!) 97.2 F (36.2 C), temperature source Temporal, resp. rate 18, height 5' 5.75" (1.67 m), weight 125 lb 1.6 oz (56.7 kg), SpO2 99%, unknown if currently breastfeeding. Body mass index is 20.35 kg/m.    Physical Exam   Diagnostic  studies: {Blank single:19197::"none","deferred due to recent antihistamine use","deferred due to insurance stipulations that require a separate visit for testing","labs sent instead"," "}  Spirometry: {Blank single:19197::"results normal (FEV1: ***%, FVC: ***%, FEV1/FVC: ***%)","results abnormal (FEV1: ***%, FVC: ***%, FEV1/FVC: ***%)"}.    {Blank single:19197::"Spirometry consistent with mild obstructive disease","Spirometry consistent with moderate obstructive disease","Spirometry consistent with severe obstructive disease","Spirometry consistent with possible restrictive disease","Spirometry consistent with mixed obstructive and restrictive disease","Spirometry uninterpretable due to technique","Spirometry consistent with normal pattern"}. {Blank single:19197::"Albuterol/Atrovent nebulizer","Xopenex/Atrovent nebulizer","Albuterol nebulizer","Albuterol four puffs via MDI","Xopenex four puffs via MDI"} treatment given in clinic with {Blank single:19197::"significant improvement in FEV1 per ATS criteria","significant improvement in FVC per ATS criteria","significant improvement in FEV1 and FVC per ATS criteria","improvement in FEV1, but not significant per ATS criteria","improvement in FVC, but not significant per ATS criteria","improvement in FEV1 and FVC, but not significant per ATS criteria","no improvement"}.  Allergy  Studies: {Blank single:19197::"none","deferred due to recent antihistamine use","deferred due to insurance stipulations that require a separate visit for testing","labs sent instead"," "}    {Blank single:19197::"Allergy  testing results were read and interpreted by myself, documented by clinical staff."," "}      Drexel Gentles, MD  [facility]

## 2023-07-01 NOTE — Patient Instructions (Addendum)
 1. Rash - Please stop the Elidel  (since you did not receive it) and start tacrolimus instead.  - Definitely call us  when this happens again.   2. Chronic rhinitis - Previous testing showed: indoor molds and dust mites - Continue with: Claritin (loratadine) 10mg  tablet once daily - You can use an extra dose of the antihistamine, if needed, for breakthrough symptoms.  - Consider nasal saline rinses 1-2 times daily to remove allergens from the nasal cavities as well as help with mucous clearance (this is especially helpful to do before the nasal sprays are given) - Consider allergy  shots as a means of long-term control. - Allergy  shots "re-train" and "reset" the immune system to ignore environmental allergens and decrease the resulting immune response to those allergens (sneezing, itchy watery eyes, runny nose, nasal congestion, etc).    - Allergy  shots improve symptoms in 75-85% of patients.  - We can discuss more at the next appointment if the medications are not working for you. - I am not sure that shots are needed since your rhinitis symptoms are not too severe, however.   3. Return in about 1 year (around 06/30/2024).    Please inform us  of any Emergency Department visits, hospitalizations, or changes in symptoms. Call us  before going to the ED for breathing or allergy  symptoms since we might be able to fit you in for a sick visit. Feel free to contact us  anytime with any questions, problems, or concerns.  It was a pleasure to see you again today!  Websites that have reliable patient information: 1. American Academy of Asthma, Allergy , and Immunology: www.aaaai.org 2. Food Allergy  Research and Education (FARE): foodallergy.org 3. Mothers of Asthmatics: http://www.asthmacommunitynetwork.org 4. American College of Allergy , Asthma, and Immunology: www.acaai.org      "Like" us  on Facebook and Instagram for our latest updates!      A healthy democracy works best when [employer]  participate! Make sure you are registered to vote! If you have moved or changed any of your contact information, you will need to get this updated before voting! Scan the QR codes below to learn more!

## 2024-02-20 ENCOUNTER — Other Ambulatory Visit: Payer: Self-pay | Admitting: Family Medicine

## 2024-02-20 DIAGNOSIS — R928 Other abnormal and inconclusive findings on diagnostic imaging of breast: Secondary | ICD-10-CM

## 2024-02-26 ENCOUNTER — Ambulatory Visit
Admission: RE | Admit: 2024-02-26 | Discharge: 2024-02-26 | Disposition: A | Discharge status: Home / Self Care | Source: Ambulatory Visit | Attending: Family Medicine | Admitting: Family Medicine

## 2024-02-26 ENCOUNTER — Other Ambulatory Visit: Payer: Self-pay | Admitting: Family Medicine

## 2024-02-26 DIAGNOSIS — R928 Other abnormal and inconclusive findings on diagnostic imaging of breast: Secondary | ICD-10-CM

## 2024-02-26 DIAGNOSIS — N6320 Unspecified lump in the left breast, unspecified quadrant: Secondary | ICD-10-CM

## 2024-03-12 ENCOUNTER — Ambulatory Visit: Admission: RE | Admit: 2024-03-12 | Source: Ambulatory Visit

## 2024-03-12 DIAGNOSIS — R928 Other abnormal and inconclusive findings on diagnostic imaging of breast: Secondary | ICD-10-CM

## 2024-03-12 DIAGNOSIS — N6320 Unspecified lump in the left breast, unspecified quadrant: Secondary | ICD-10-CM
